# Patient Record
Sex: Female | Born: 1935 | Race: White | Hispanic: No | Smoking: Never smoker
Health system: Southern US, Community
[De-identification: ages and names within clinical notes are randomized; demographics above are authoritative.]

## PROBLEM LIST (undated history)

## (undated) ENCOUNTER — Emergency Department (HOSPITAL_COMMUNITY): Admission: EM | Payer: Medicare Other | Source: Home / Self Care

## (undated) DIAGNOSIS — F32A Depression, unspecified: Secondary | ICD-10-CM

## (undated) DIAGNOSIS — E785 Hyperlipidemia, unspecified: Secondary | ICD-10-CM

## (undated) DIAGNOSIS — F329 Major depressive disorder, single episode, unspecified: Secondary | ICD-10-CM

## (undated) DIAGNOSIS — H3530 Unspecified macular degeneration: Secondary | ICD-10-CM

## (undated) DIAGNOSIS — G5710 Meralgia paresthetica, unspecified lower limb: Secondary | ICD-10-CM

## (undated) DIAGNOSIS — R059 Cough, unspecified: Secondary | ICD-10-CM

## (undated) DIAGNOSIS — R251 Tremor, unspecified: Secondary | ICD-10-CM

## (undated) DIAGNOSIS — M25561 Pain in right knee: Secondary | ICD-10-CM

## (undated) DIAGNOSIS — R05 Cough: Secondary | ICD-10-CM

## (undated) HISTORY — DX: Cough: R05

## (undated) HISTORY — DX: Major depressive disorder, single episode, unspecified: F32.9

## (undated) HISTORY — DX: Cough, unspecified: R05.9

## (undated) HISTORY — DX: Depression, unspecified: F32.A

## (undated) HISTORY — DX: Tremor, unspecified: R25.1

## (undated) HISTORY — DX: Hyperlipidemia, unspecified: E78.5

## (undated) HISTORY — PX: APPENDECTOMY: SHX54

## (undated) HISTORY — DX: Pain in right knee: M25.561

## (undated) HISTORY — DX: Meralgia paresthetica, unspecified lower limb: G57.10

## (undated) HISTORY — DX: Unspecified macular degeneration: H35.30

## (undated) HISTORY — PX: TONSILLECTOMY AND ADENOIDECTOMY: SHX28

---

## 1998-07-14 ENCOUNTER — Other Ambulatory Visit: Admission: RE | Admit: 1998-07-14 | Discharge: 1998-07-14 | Payer: Self-pay | Admitting: Internal Medicine

## 1999-05-06 ENCOUNTER — Emergency Department (HOSPITAL_COMMUNITY): Admission: EM | Admit: 1999-05-06 | Discharge: 1999-05-06 | Payer: Self-pay | Admitting: Emergency Medicine

## 1999-05-06 ENCOUNTER — Encounter: Payer: Self-pay | Admitting: Emergency Medicine

## 1999-09-14 ENCOUNTER — Other Ambulatory Visit: Admission: RE | Admit: 1999-09-14 | Discharge: 1999-09-14 | Payer: Self-pay | Admitting: Internal Medicine

## 1999-10-05 ENCOUNTER — Encounter: Payer: Self-pay | Admitting: Internal Medicine

## 1999-10-05 ENCOUNTER — Encounter: Admission: RE | Admit: 1999-10-05 | Discharge: 1999-10-05 | Payer: Self-pay | Admitting: Internal Medicine

## 2000-09-17 ENCOUNTER — Other Ambulatory Visit: Admission: RE | Admit: 2000-09-17 | Discharge: 2000-09-17 | Payer: Self-pay | Admitting: Internal Medicine

## 2000-12-04 ENCOUNTER — Encounter: Payer: Self-pay | Admitting: Internal Medicine

## 2000-12-04 ENCOUNTER — Encounter: Admission: RE | Admit: 2000-12-04 | Discharge: 2000-12-04 | Payer: Self-pay | Admitting: Internal Medicine

## 2001-09-18 ENCOUNTER — Other Ambulatory Visit: Admission: RE | Admit: 2001-09-18 | Discharge: 2001-09-18 | Payer: Self-pay | Admitting: Internal Medicine

## 2001-12-16 ENCOUNTER — Encounter: Admission: RE | Admit: 2001-12-16 | Discharge: 2001-12-16 | Payer: Self-pay | Admitting: Internal Medicine

## 2001-12-16 ENCOUNTER — Encounter: Payer: Self-pay | Admitting: Internal Medicine

## 2002-09-21 ENCOUNTER — Other Ambulatory Visit: Admission: RE | Admit: 2002-09-21 | Discharge: 2002-09-21 | Payer: Self-pay | Admitting: Internal Medicine

## 2002-12-22 ENCOUNTER — Encounter: Payer: Self-pay | Admitting: Internal Medicine

## 2002-12-22 ENCOUNTER — Encounter: Admission: RE | Admit: 2002-12-22 | Discharge: 2002-12-22 | Payer: Self-pay | Admitting: Internal Medicine

## 2003-09-15 ENCOUNTER — Ambulatory Visit (HOSPITAL_COMMUNITY): Admission: RE | Admit: 2003-09-15 | Discharge: 2003-09-15 | Payer: Self-pay | Admitting: *Deleted

## 2003-09-15 ENCOUNTER — Encounter (INDEPENDENT_AMBULATORY_CARE_PROVIDER_SITE_OTHER): Payer: Self-pay | Admitting: Specialist

## 2003-09-27 ENCOUNTER — Other Ambulatory Visit: Admission: RE | Admit: 2003-09-27 | Discharge: 2003-09-27 | Payer: Self-pay | Admitting: Internal Medicine

## 2003-12-28 ENCOUNTER — Encounter: Admission: RE | Admit: 2003-12-28 | Discharge: 2003-12-28 | Payer: Self-pay | Admitting: Internal Medicine

## 2004-02-10 ENCOUNTER — Ambulatory Visit (HOSPITAL_COMMUNITY): Admission: RE | Admit: 2004-02-10 | Discharge: 2004-02-10 | Payer: Self-pay | Admitting: Orthopedic Surgery

## 2004-03-13 ENCOUNTER — Ambulatory Visit (HOSPITAL_BASED_OUTPATIENT_CLINIC_OR_DEPARTMENT_OTHER): Admission: RE | Admit: 2004-03-13 | Discharge: 2004-03-13 | Payer: Self-pay | Admitting: Orthopedic Surgery

## 2004-05-08 ENCOUNTER — Ambulatory Visit (HOSPITAL_COMMUNITY): Admission: RE | Admit: 2004-05-08 | Discharge: 2004-05-08 | Payer: Self-pay | Admitting: Orthopedic Surgery

## 2004-10-20 ENCOUNTER — Other Ambulatory Visit: Admission: RE | Admit: 2004-10-20 | Discharge: 2004-10-20 | Payer: Self-pay | Admitting: Internal Medicine

## 2005-01-18 ENCOUNTER — Encounter: Admission: RE | Admit: 2005-01-18 | Discharge: 2005-01-18 | Payer: Self-pay | Admitting: Internal Medicine

## 2006-03-12 ENCOUNTER — Encounter: Admission: RE | Admit: 2006-03-12 | Discharge: 2006-03-12 | Payer: Self-pay | Admitting: Internal Medicine

## 2006-07-02 HISTORY — PX: LUNG BIOPSY: SHX232

## 2007-03-14 ENCOUNTER — Encounter: Admission: RE | Admit: 2007-03-14 | Discharge: 2007-03-14 | Payer: Self-pay | Admitting: Internal Medicine

## 2007-12-25 ENCOUNTER — Other Ambulatory Visit: Admission: RE | Admit: 2007-12-25 | Discharge: 2007-12-25 | Payer: Self-pay | Admitting: Internal Medicine

## 2007-12-26 ENCOUNTER — Encounter: Admission: RE | Admit: 2007-12-26 | Discharge: 2007-12-26 | Payer: Self-pay | Admitting: Internal Medicine

## 2008-01-06 ENCOUNTER — Ambulatory Visit (HOSPITAL_COMMUNITY): Admission: RE | Admit: 2008-01-06 | Discharge: 2008-01-06 | Payer: Self-pay | Admitting: Internal Medicine

## 2008-01-08 ENCOUNTER — Ambulatory Visit (HOSPITAL_COMMUNITY): Admission: RE | Admit: 2008-01-08 | Discharge: 2008-01-08 | Payer: Self-pay | Admitting: Internal Medicine

## 2008-03-25 ENCOUNTER — Encounter: Admission: RE | Admit: 2008-03-25 | Discharge: 2008-03-25 | Payer: Self-pay | Admitting: Internal Medicine

## 2008-07-09 ENCOUNTER — Emergency Department (HOSPITAL_COMMUNITY): Admission: EM | Admit: 2008-07-09 | Discharge: 2008-07-09 | Payer: Self-pay | Admitting: Emergency Medicine

## 2008-07-12 ENCOUNTER — Encounter: Admission: RE | Admit: 2008-07-12 | Discharge: 2008-07-12 | Payer: Self-pay | Admitting: Internal Medicine

## 2008-12-08 ENCOUNTER — Encounter: Admission: RE | Admit: 2008-12-08 | Discharge: 2008-12-08 | Payer: Self-pay | Admitting: Internal Medicine

## 2009-03-29 ENCOUNTER — Encounter: Admission: RE | Admit: 2009-03-29 | Discharge: 2009-03-29 | Payer: Self-pay | Admitting: Internal Medicine

## 2009-05-18 ENCOUNTER — Encounter: Admission: RE | Admit: 2009-05-18 | Discharge: 2009-05-18 | Payer: Self-pay | Admitting: Internal Medicine

## 2009-06-07 ENCOUNTER — Emergency Department (HOSPITAL_COMMUNITY): Admission: EM | Admit: 2009-06-07 | Discharge: 2009-06-07 | Payer: Self-pay | Admitting: Emergency Medicine

## 2010-06-18 IMAGING — CT CT HEAD W/O CM
1 of 2 series · 15 of 30 positions shown, 19 images · non-contrast
Comparison: No comparison head or facial CT.  Comparison sinus CT
[DATE].

CT HEAD

CLINICAL DATA: Fall.  Abrasions.  Headache.

CT HEAD WITHOUT CONTRAST
CT MAXILLOFACIAL WITHOUT CONTRAST
TECHNIQUE: Multidetector CT imaging of the head and maxillofacial
structures were performed using the standard protocol without
intravenous contrast. Multiplanar CT image reconstructions of the
maxillofacial structures were also generated.

[Series 3: orbit 2.0 h32s · axial · 0.29mm/px · z∈[-261,-117]mm · 15 of 80 slices shown, 19 images]
[im 4/80  brain]
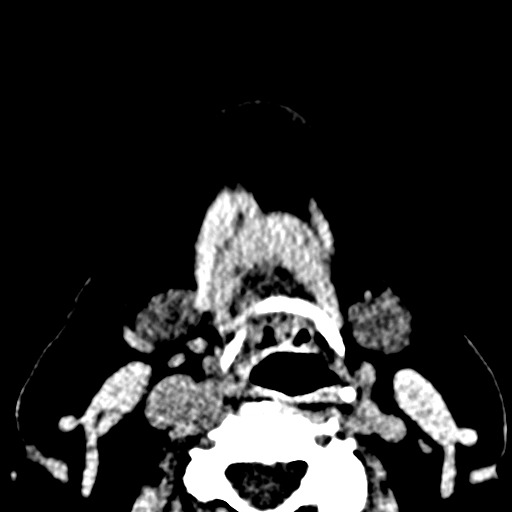
[im 4/80  bone]
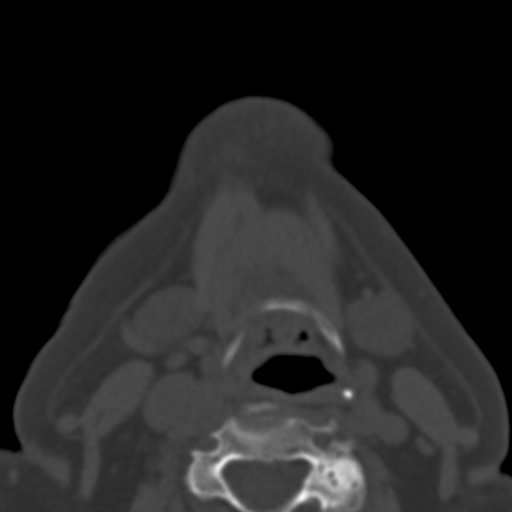
[im 8/80  brain]
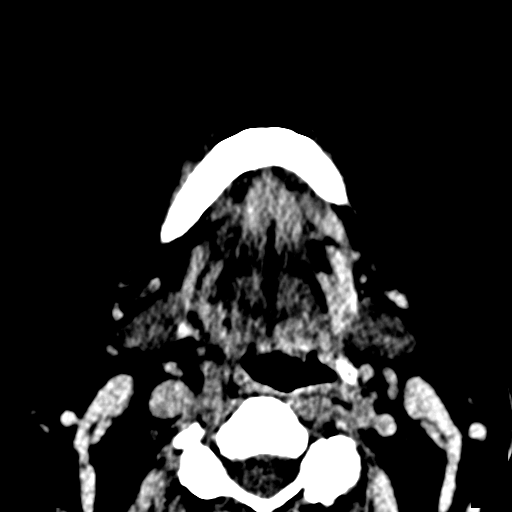
[im 16/80  brain]
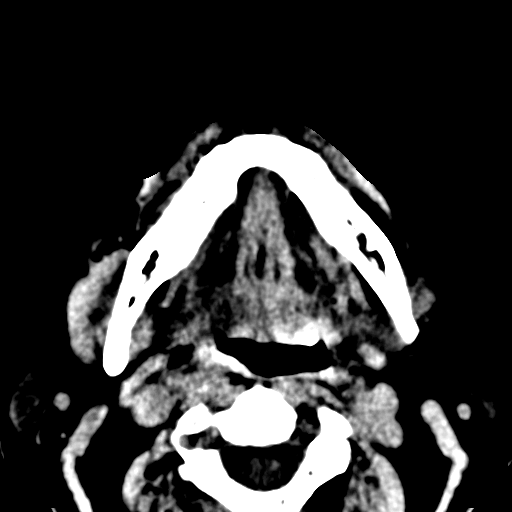
[im 20/80  brain]
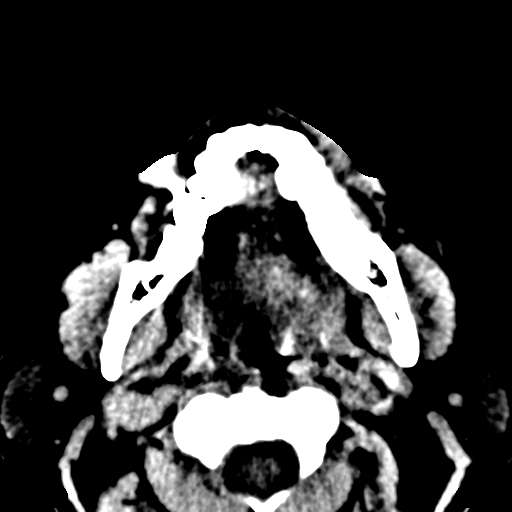
[im 24/80  brain]
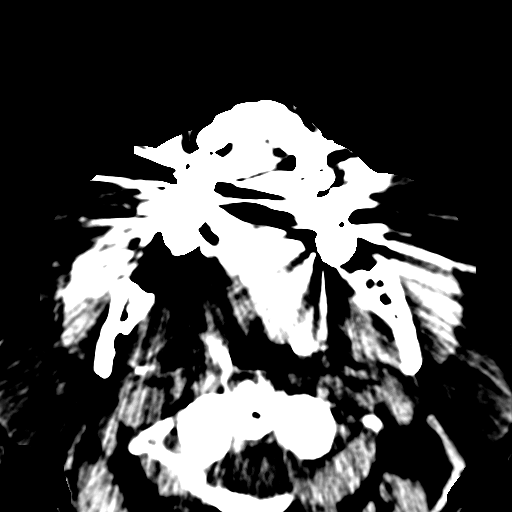
[im 24/80  bone]
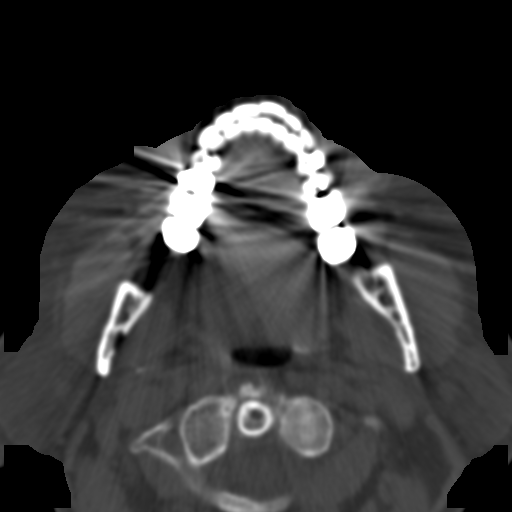
[im 28/80  brain]
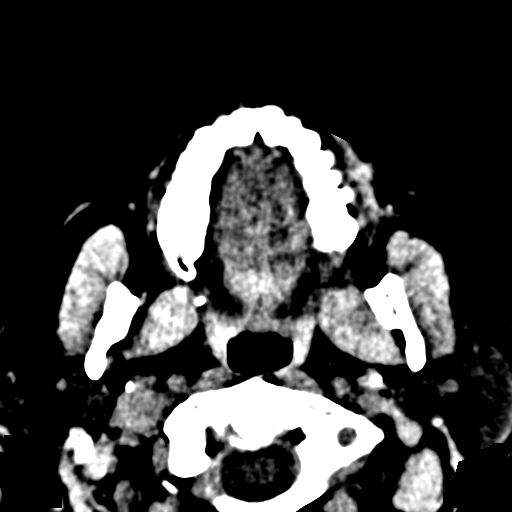
[im 36/80  brain]
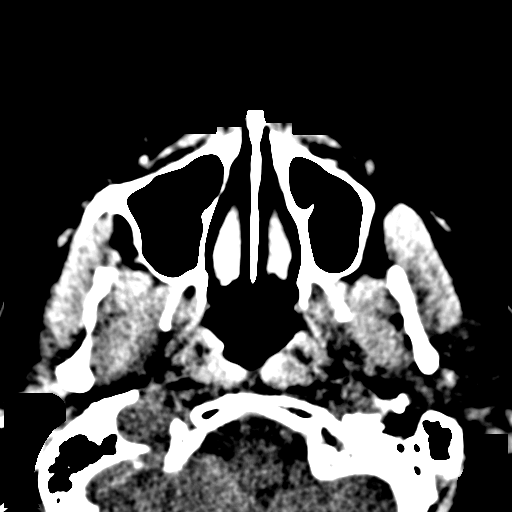
[im 40/80  brain]
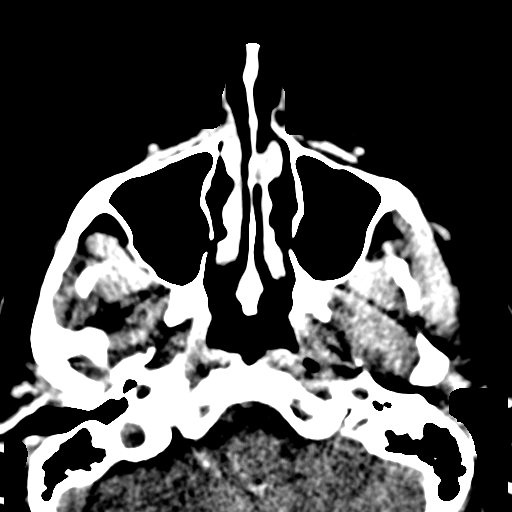
[im 44/80  brain]
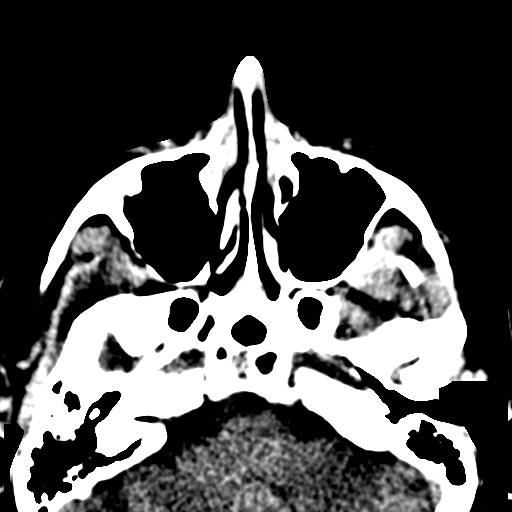
[im 44/80  bone]
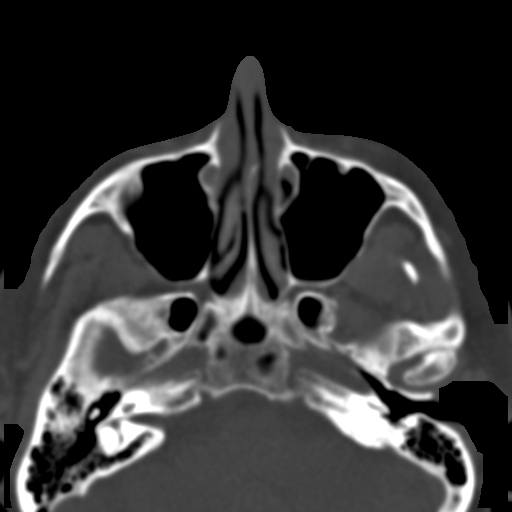
[im 52/80  brain]
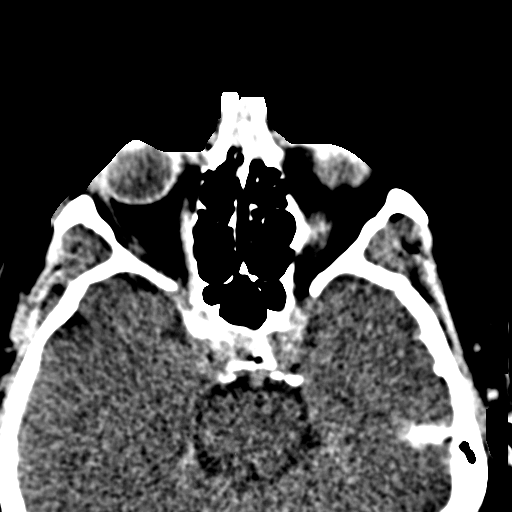
[im 56/80  brain]
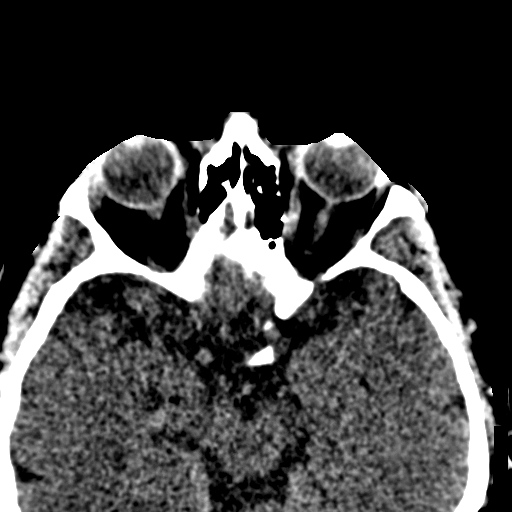
[im 60/80  brain]
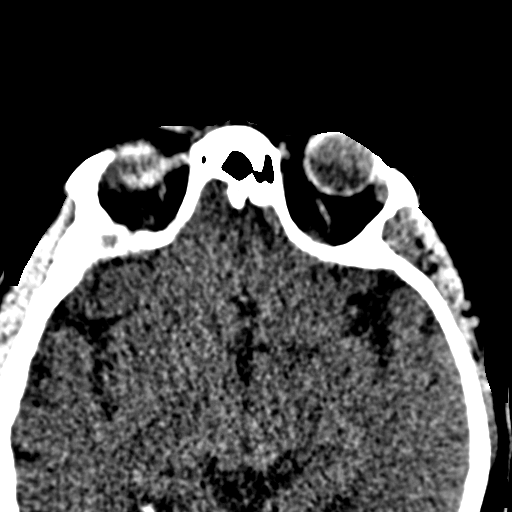
[im 64/80  brain]
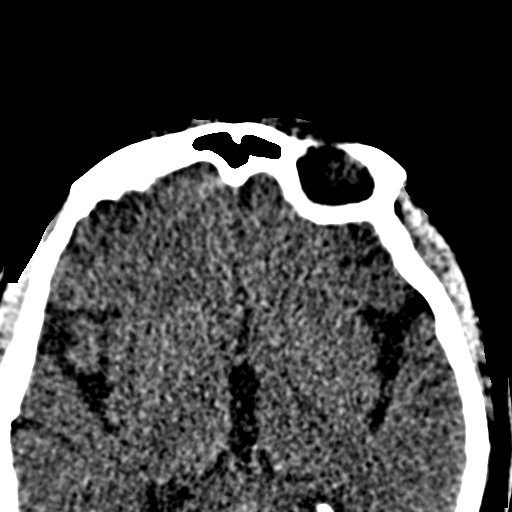
[im 64/80  bone]
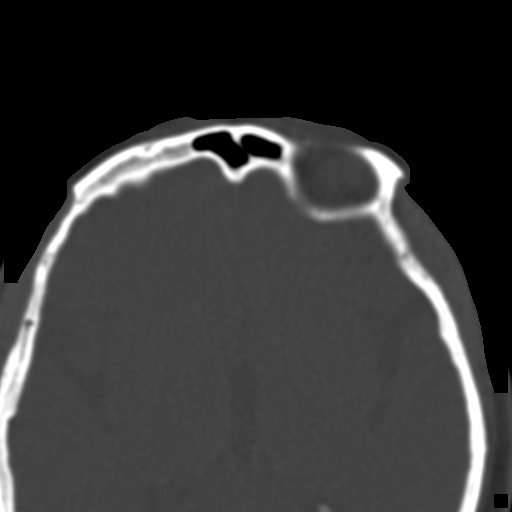
[im 72/80  brain]
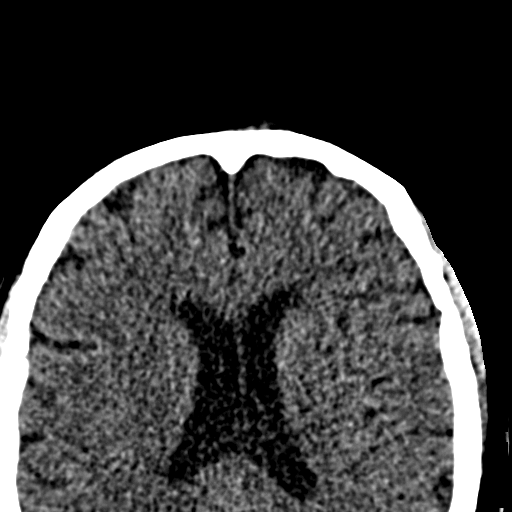
[im 76/80  brain]
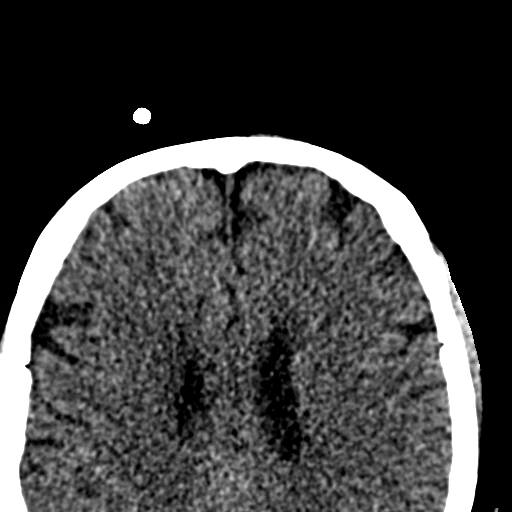

[15 of 30 positions shown; findings below may reference images not displayed]

FINDINGS: Nasal bone fracture.  Please see below.  No skull
fracture otherwise detected.  No intracranial hemorrhage.  No CT
evidence of large acute infarct.  Small acute infarct cannot be
excluded by CT. No intracranial mass detected on this unenhanced
exam.
IMPRESSION: Nasal bone fracture.

No intracranial hemorrhage.

CT MAXILLOFACIAL
FINDINGS: Bilateral nasal bone fracture with mild displacement.
No other fracture identified.  Small amount of fluid within the
left maxillary sinus.
IMPRESSION: Bilateral nasal bone fracture.

## 2010-06-18 IMAGING — CR DG HAND COMPLETE 3+V*L*
3 series · 3 of 3 positions shown · non-contrast
Comparison: None

CLINICAL DATA: Pain

LEFT HAND - COMPLETE 3+ VIEW

[view not recorded (1 of 3)]
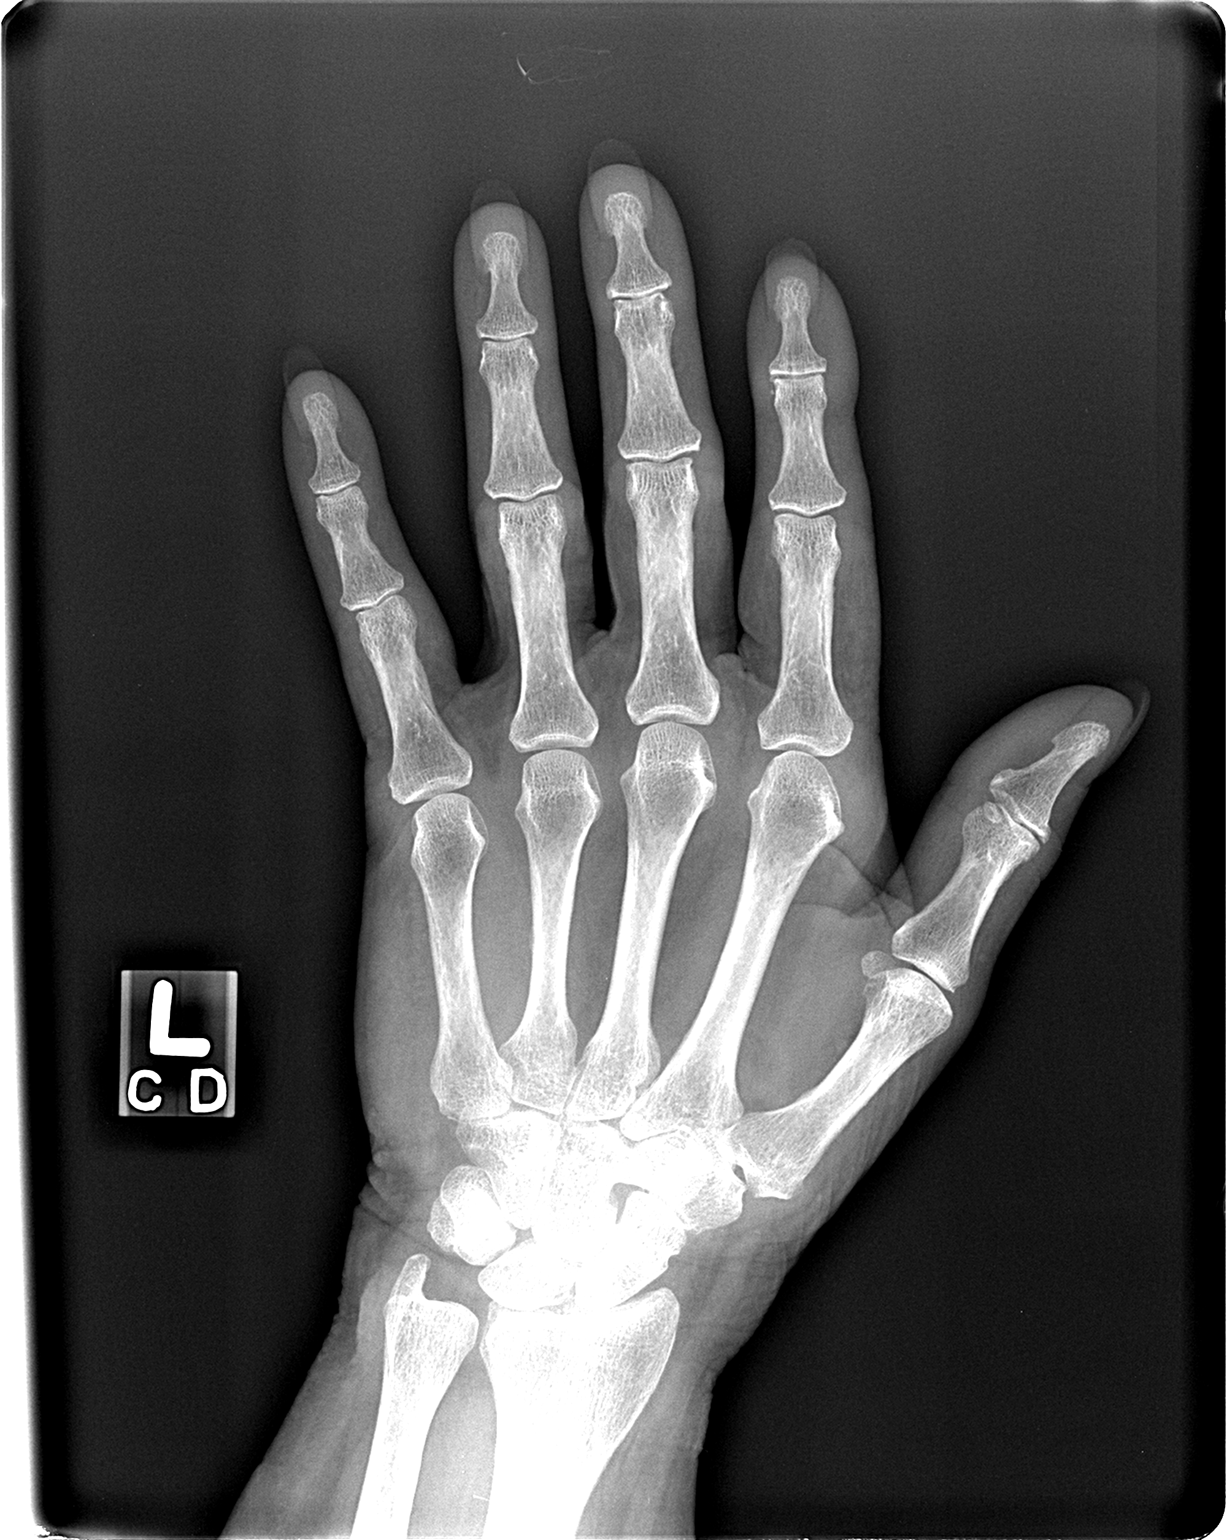

[view not recorded (2 of 3)]
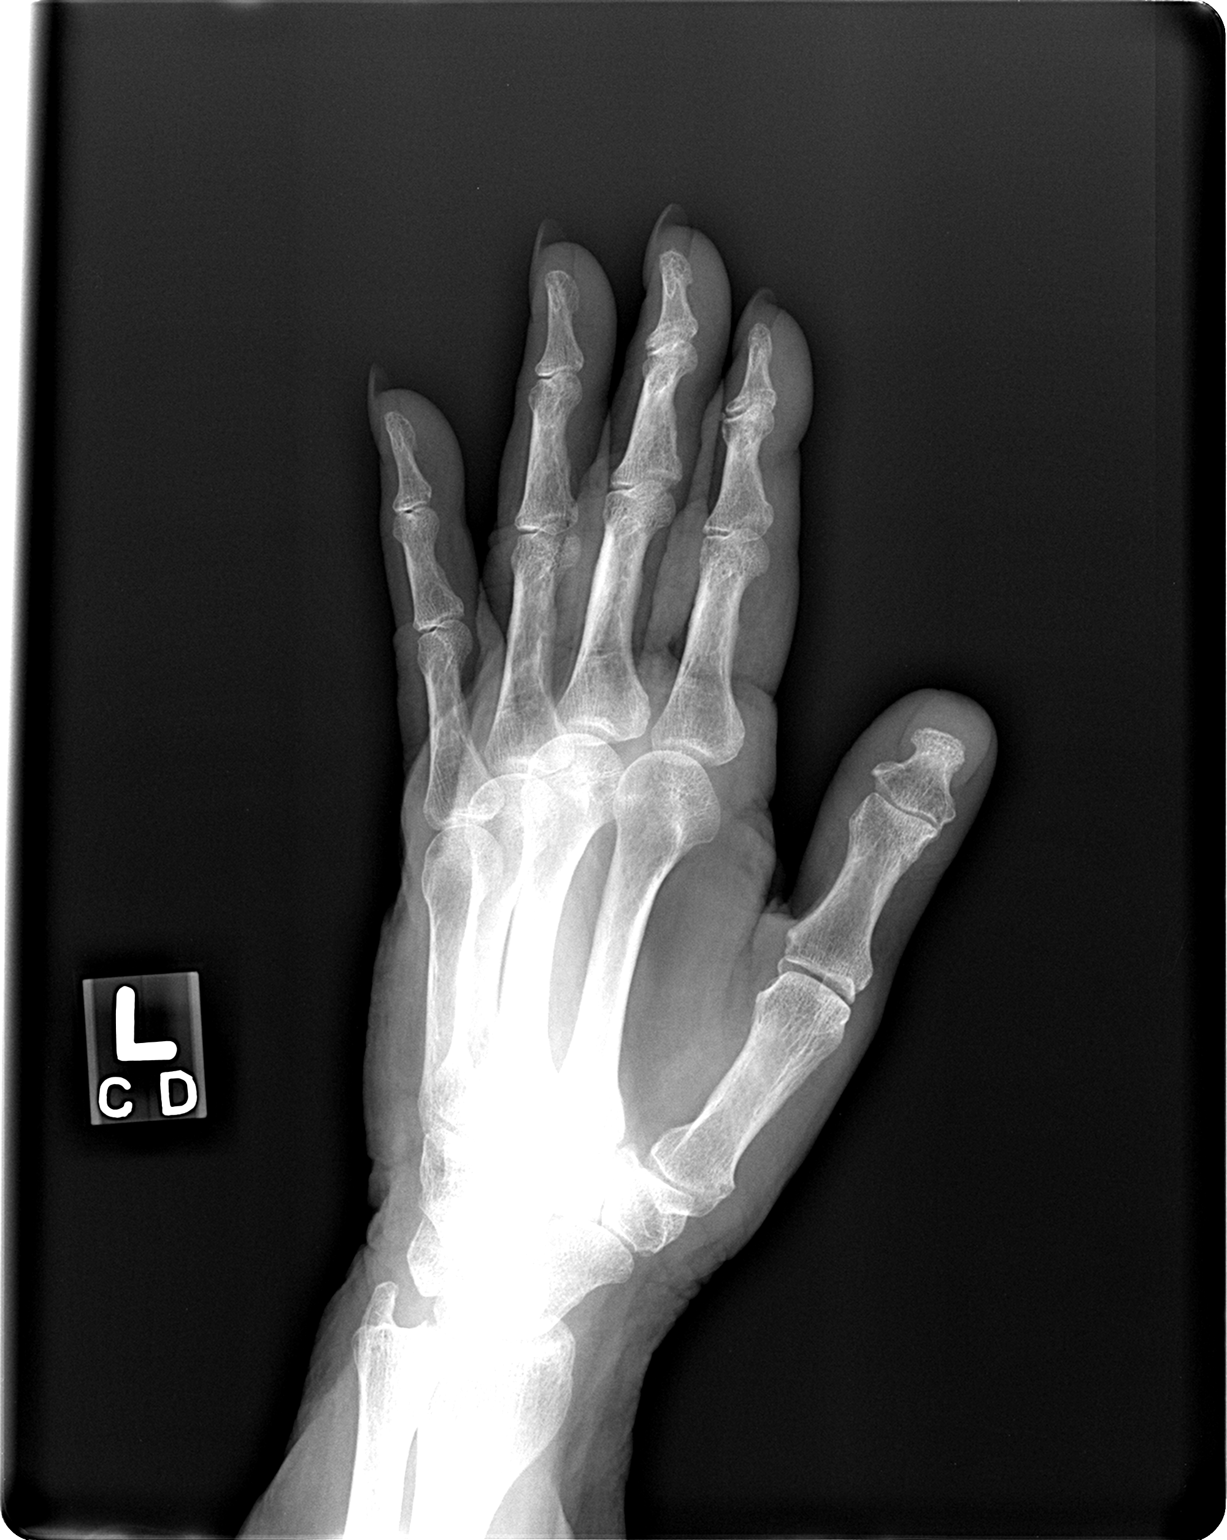

[view not recorded (3 of 3)]
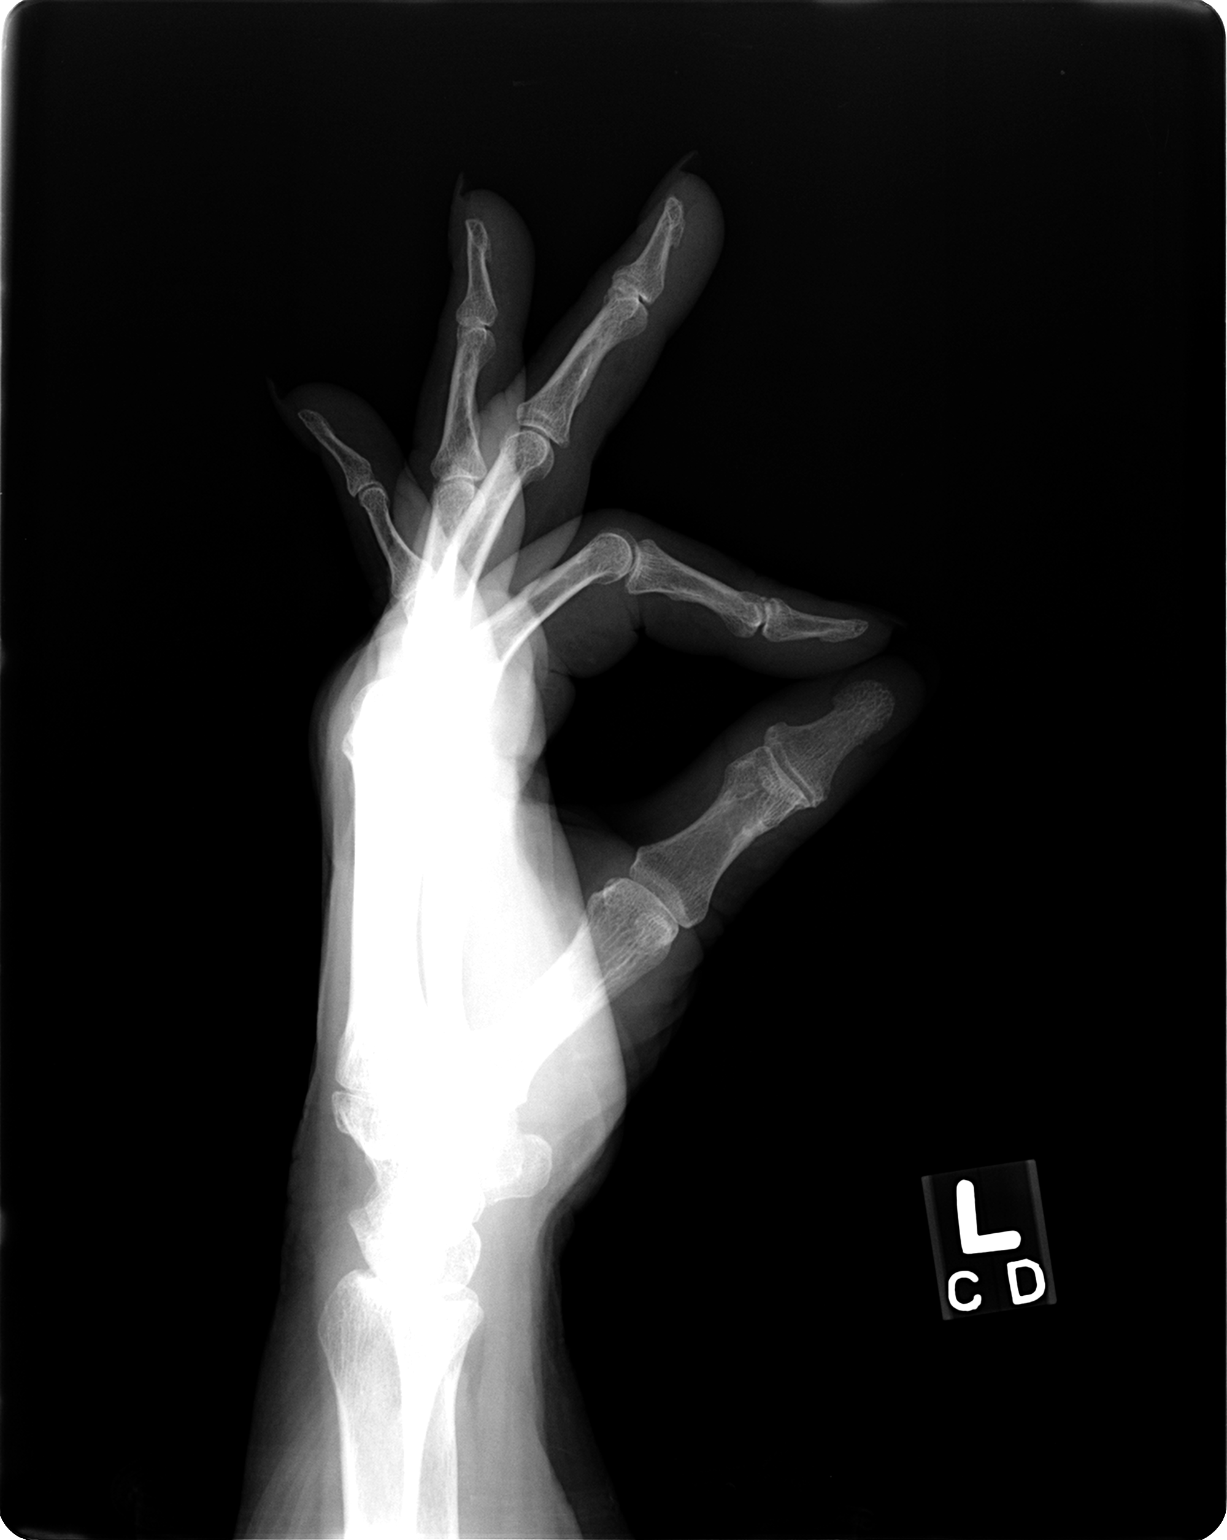

[3 of 3 positions shown; findings below may reference images not displayed]

FINDINGS: Three views of the left hand submitted.  No acute fracture or
subluxation.  No radiopaque foreign body.
IMPRESSION: No acute fracture or subluxation.

## 2010-07-18 ENCOUNTER — Encounter
Admission: RE | Admit: 2010-07-18 | Discharge: 2010-07-18 | Payer: Self-pay | Source: Home / Self Care | Attending: Internal Medicine | Admitting: Internal Medicine

## 2010-07-23 ENCOUNTER — Encounter: Payer: Self-pay | Admitting: Internal Medicine

## 2010-10-16 LAB — CK TOTAL AND CKMB (NOT AT ARMC)
CK, MB: 1.8 ng/mL (ref 0.3–4.0)
Relative Index: INVALID (ref 0.0–2.5)
Total CK: 78 U/L (ref 7–177)

## 2010-10-16 LAB — DIFFERENTIAL
Basophils Relative: 1 % (ref 0–1)
Eosinophils Absolute: 0 10*3/uL (ref 0.0–0.7)
Eosinophils Relative: 0 % (ref 0–5)
Lymphocytes Relative: 16 % (ref 12–46)
Lymphs Abs: 1.7 10*3/uL (ref 0.7–4.0)
Neutro Abs: 8.1 10*3/uL — ABNORMAL HIGH (ref 1.7–7.7)
Neutrophils Relative %: 79 % — ABNORMAL HIGH (ref 43–77)

## 2010-10-16 LAB — COMPREHENSIVE METABOLIC PANEL
ALT: 19 U/L (ref 0–35)
BUN: 8 mg/dL (ref 6–23)
CO2: 22 mEq/L (ref 19–32)
Chloride: 108 mEq/L (ref 96–112)
Total Bilirubin: 1.1 mg/dL (ref 0.3–1.2)
Total Protein: 6.1 g/dL (ref 6.0–8.3)

## 2010-10-16 LAB — CBC
MCHC: 33.9 g/dL (ref 30.0–36.0)
MCV: 90.3 fL (ref 78.0–100.0)
RBC: 4.69 MIL/uL (ref 3.87–5.11)

## 2010-11-17 NOTE — Op Note (Signed)
NAMEKORALINE, PHILLIPSON                         ACCOUNT NO.:  [id number]   MEDICAL RECORD NO.:  [id number]                   PATIENT TYPE:  AMB   LOCATION:  ENDO                                 FACILITY:  [facility]   PHYSICIAN:  Georgiana Spinner, M.D.                 DATE OF BIRTH:  01-May-1932   DATE OF PROCEDURE:  09/15/2003  DATE OF DISCHARGE:                                 OPERATIVE REPORT   PROCEDURE:  Upper endoscopy.   INDICATIONS:  Gastroesophageal reflux disease.   ANESTHESIA:  1. Demerol 60 mg.  2. Versed 6 mg.   DESCRIPTION OF PROCEDURE:  With patient mildly sedated in the left lateral  decubitus position, the Olympus videoscopic endoscope was inserted in the  mouth, passed under direct vision through the esophagus, which showed some  irregularity of the Z-line which was photographed.  But first, we entered  into the stomach.  Fundus, body, antrum, duodenal bulb, second portion of  duodenum were visualized.  From this point, the endoscope was slowly  withdrawn, taking circumferential views of the duodenal mucosa until the  endoscope then pulled back into the stomach, placed in retroflexion to view  the stomach from below.  The endoscope was then straightened, withdrawn,  taking circumferential views of the remaining gastric and esophageal mucosa,  stopping in the fundus of the stomach where there were changes in the  stomach of geophagism and I photographed and biopsied to rule out gastritis.  The endoscope was then pulled back into the distal esophagus where the area  of the distal esophagus was biopsied just one time, and I decided not to  biopsy further.  The endoscope was withdrawn.  The patient's vital signs and  pulse oximeter remained stable.  The patient tolerated the procedure well  without apparent complications.   FINDINGS:  1. Changes as described above.  Geophagism of the stomach and changes of     irregularity of the distal esophagus, both biopsied.  2. Await  biopsy report.  3. The patient will call me for results and follow up with me as an     outpatient.  4. Proceed to colonoscopy as planned.                                               Georgiana Spinner, M.D.    GMO/MEDQ  D:  09/15/2003  T:  09/15/2003  Job:  [id number]

## 2010-11-17 NOTE — Op Note (Signed)
NAMETIMMIA, COGBURN                         ACCOUNT NO.:  [id number]   MEDICAL RECORD NO.:  [id number]                   PATIENT TYPE:  AMB   LOCATION:  ENDO                                 FACILITY:  [facility]   PHYSICIAN:  Georgiana Spinner, M.D.                 DATE OF BIRTH:  01/25/1932   DATE OF PROCEDURE:  09/15/2003  DATE OF DISCHARGE:                                 OPERATIVE REPORT   PROCEDURE:  Colonoscopy.   INDICATIONS:  Colon polyps and rectal bleeding.   ANESTHESIA:  1. Demerol 10 mg.  2. Versed 1 mg.   DESCRIPTION OF PROCEDURE:  With the patient mildly sedated in the left  lateral decubitus position, the Olympus videoscopic colonoscope was inserted  in the rectum and passed under direct vision to the cecum, identified by the  ileocecal valve and appendiceal orifice, both of which were photographed.  From this point, the colonoscope was slowly withdrawn, taking  circumferential views of the colonic mucosa, stopping only in the rectum  which appeared normal on direct, showed hemorrhoids on retroflexed view.  The endoscope was straightened and withdrawn.  The patient's vital signs and  pulse oximeter remained stable.  The patient tolerated the procedure well  without apparent complications.   FINDINGS:  Internal hemorrhoids, otherwise unremarkable exam.   PLAN:  See endoscopy note for further details.                                               Georgiana Spinner, M.D.    GMO/MEDQ  D:  09/15/2003  T:  09/15/2003  Job:  [id number]

## 2010-11-17 NOTE — Op Note (Signed)
NAMELILLAN, MCCREADIE                         ACCOUNT NO.:  [id number]   MEDICAL RECORD NO.:  [id number]                   PATIENT TYPE:  AMB   LOCATION:  DSC                                  FACILITY:  [facility]   PHYSICIAN:  Robert A. Thurston Hole, M.D.              DATE OF BIRTH:  May 19, 1932   DATE OF PROCEDURE:  03/13/2004  DATE OF DISCHARGE:                                 OPERATIVE REPORT   PREOPERATIVE DIAGNOSIS:  Left knee chondromalacia and synovitis.   POSTOPERATIVE DIAGNOSIS:  1.  Left knee medial and lateral meniscal tears.  2.  Left knee chondromalacia.  3.  Left knee synovitis.   PROCEDURE:  1.  Left knee examination under anesthesia for an arthroscopic partial      medial and lateral meniscectomies.  2.  Left knee chondroplasty with partial synovectomy.   SURGEON:  Elana Alm. Thurston Hole, M.D.   ASSISTANT:  Julien Girt, P.A.   ANESTHESIA:  General.   OPERATIVE TIME:  40 minutes   COMPLICATIONS:  None.   INDICATIONS FOR PROCEDURE:  Ms. Sochacki is a 77 year old woman who has had  three to four months of increasing left knee pain with exam and MRI  documenting chondromalacia and synovitis with possible meniscal tearing, who  has failed conservative care and is now to undergo arthroscopy.   DESCRIPTION OF PROCEDURE:  Ms. Ebbert is brought to the operating room on  March 13, 2004 and placed on the operative table in the supine position.  After an adequate level of general anesthesia was attained, her left knee  was examined. Range of motion is 0 to 125 degrees, 1 to 2+ crepitation. Knee  stable to ligamentous examination and normal patellar tracking.  The knee  was injected with 0.25% Marcaine with epinephrine in the medial and lateral  portal sites under sterile conditions.  The left leg was then prepped using  sterile Duraprep and draped using sterile technique.  Originally through the  anterior and lateral portal, the arthroscope with the pump attached was  placed  into the anteromedial portal and an arthroscopic probe was placed.  On initial inspection of the medial compartment, she was found to have 75%  grade 3 chondromalacia, which was debrided.  The medial meniscus tearing of  posterior medial horn, of which 50% was resected back to a stable rim.  The  intercondylar notch was inspected and the anterior and posterior cruciate  ligaments were normal.  The lateral compartment was inspected and 25% grade  3 chondromalacia was debrided.  The lateral meniscus tear, 25% to 30%,  posterior and lateral corner was resected back to a stable rim.  The  patellofemoral joint showed 50% grade 3 chondromalacia on the patella and  femoral groove and this was debrided.  The patella tracked normally.  Moderate synovitis of the medial and lateral gutters were debrided,  otherwise they were free of pathology.  After this was done,  it was felt  that all pathology had been satisfactorily addressed.  The instruments were  removed.  The portals were closed with 3-0 nylon suture and injected with  0.25% Marcaine with epinephrine and 4 mg of morphine.  Sterile dressings  were applied and the patient awakened and taken to the recovery room in  stable condition.   FOLLOW-UP CARE:  Ms. Pinkus will be followed as an outpatient on Vicodin and  Voltaren. She will be back in my office in a week for sutures out and  followup.                                               Robert A. Thurston Hole, M.D.    RAW/MEDQ  D:  03/13/2004  T:  03/13/2004  Job:  [id number]

## 2011-08-07 ENCOUNTER — Other Ambulatory Visit: Payer: Self-pay | Admitting: Internal Medicine

## 2011-08-07 DIAGNOSIS — Z1231 Encounter for screening mammogram for malignant neoplasm of breast: Secondary | ICD-10-CM

## 2011-08-16 DIAGNOSIS — N390 Urinary tract infection, site not specified: Secondary | ICD-10-CM | POA: Diagnosis not present

## 2011-08-16 DIAGNOSIS — J069 Acute upper respiratory infection, unspecified: Secondary | ICD-10-CM | POA: Diagnosis not present

## 2011-08-16 DIAGNOSIS — R109 Unspecified abdominal pain: Secondary | ICD-10-CM | POA: Diagnosis not present

## 2011-08-16 DIAGNOSIS — R1083 Colic: Secondary | ICD-10-CM | POA: Diagnosis not present

## 2011-08-24 DIAGNOSIS — J209 Acute bronchitis, unspecified: Secondary | ICD-10-CM | POA: Diagnosis not present

## 2011-09-05 ENCOUNTER — Ambulatory Visit: Payer: Self-pay

## 2011-09-06 ENCOUNTER — Ambulatory Visit
Admission: RE | Admit: 2011-09-06 | Discharge: 2011-09-06 | Disposition: A | Discharge status: Home / Self Care | Payer: Medicare Other | Source: Ambulatory Visit | Attending: Internal Medicine | Admitting: Internal Medicine

## 2011-09-06 DIAGNOSIS — Z1231 Encounter for screening mammogram for malignant neoplasm of breast: Secondary | ICD-10-CM

## 2011-09-10 DIAGNOSIS — R05 Cough: Secondary | ICD-10-CM | POA: Diagnosis not present

## 2011-09-14 DIAGNOSIS — H3530 Unspecified macular degeneration: Secondary | ICD-10-CM | POA: Diagnosis not present

## 2011-09-14 DIAGNOSIS — H35369 Drusen (degenerative) of macula, unspecified eye: Secondary | ICD-10-CM | POA: Diagnosis not present

## 2011-09-14 DIAGNOSIS — H0230 Blepharochalasis unspecified eye, unspecified eyelid: Secondary | ICD-10-CM | POA: Diagnosis not present

## 2011-09-14 DIAGNOSIS — H2510 Age-related nuclear cataract, unspecified eye: Secondary | ICD-10-CM | POA: Diagnosis not present

## 2011-11-08 DIAGNOSIS — R05 Cough: Secondary | ICD-10-CM | POA: Diagnosis not present

## 2011-12-01 ENCOUNTER — Ambulatory Visit (INDEPENDENT_AMBULATORY_CARE_PROVIDER_SITE_OTHER): Payer: Medicare Other | Admitting: Family Medicine

## 2011-12-01 VITALS — BP 152/90 | HR 84 | Temp 98.2°F | Resp 16 | Ht 63.0 in | Wt 210.0 lb

## 2011-12-01 DIAGNOSIS — Z79899 Other long term (current) drug therapy: Secondary | ICD-10-CM

## 2011-12-01 DIAGNOSIS — L259 Unspecified contact dermatitis, unspecified cause: Secondary | ICD-10-CM

## 2011-12-01 DIAGNOSIS — T7840XA Allergy, unspecified, initial encounter: Secondary | ICD-10-CM

## 2011-12-01 LAB — GLUCOSE, POCT (MANUAL RESULT ENTRY): POC Glucose: 100 mg/dl — AB (ref 70–99)

## 2011-12-01 MED ORDER — PREDNISONE 20 MG PO TABS: ORAL_TABLET | ORAL | Status: DC

## 2011-12-01 MED ORDER — METHYLPREDNISOLONE ACETATE 80 MG/ML IJ SUSP
80.0000 mg | Freq: Once | INTRAMUSCULAR | Status: AC
  Administered 2011-12-01: 80 mg via INTRAMUSCULAR

## 2011-12-01 NOTE — Progress Notes (Signed)
  Subjective:    Patient ID: Erica [name], female    DOB: May 26, 1932, 78 y.o.   MRN: [id number]  HPI Erica [name] is a 78 y.o. female  Rash around eyes - started 3 days ago - swelling around eyes.  No new dermatologic products - uses Marjorie Smolder.  Itches, no pain.  No fever.    Was at beach house night before - different bedding - down comforter and pillows.    Shellfish allergy, but no known exposure.    Now rash on back of hairline, back of neck/hairline and down L arm and chin.    No shortness of breath, eating,  Drinking, or trouble swallowing.  Has had a chronic cough. Dry cough - followed by allergist in [facility]. No recent changes.  Tx: benadryl - 1 each day.   Has taken  No history of diabetes.    Review of Systems  HENT: Negative for trouble swallowing.   Respiratory: Positive for cough. Negative for shortness of breath, wheezing and stridor.   Skin: Positive for rash.       Objective:   Physical Exam  Constitutional: She is oriented to person, place, and time. She appears well-developed and well-nourished.  HENT:  Head: Normocephalic and atraumatic.  Eyes: Conjunctivae and EOM are normal. Pupils are equal, round, and reactive to light.       Periorbital R >L erythema, slight lid edema.  Erythematous patches with excoriation in few areas of face, L chest wall and arm, and posterior neck.  Pulmonary/Chest: Effort normal and breath sounds normal. No respiratory distress. She has no wheezes.  Neurological: She is alert and oriented to person, place, and time.  Skin: Rash noted.  Psychiatric: She has a normal mood and affect. Her behavior is normal.   Results for orders placed in visit on 12/01/11  GLUCOSE, POCT (MANUAL RESULT ENTRY)      Component Value Range   POC Glucose 100 (*) 70 - 99 (mg/dl)       Assessment & Plan:   Erica [name] is a 78 y.o. female 1. Allergic reaction  POCT glucose (manual entry)  2. Contact dermatitis  POCT glucose (manual  entry)  3. High risk medication use  POCT glucose (manual entry)  Contact derm/allergic reaction - face - eyes, neck arm.  Possibly due to detergent, fabric softener, or pillow contents when at beach, as sx's started next am.    Benadryl otc - Q 4-6 h today, then can switch to Zyrtec tomorrow if improving.   No makeup to involved areas until fully cleared.   Depomedrol 80mg  IM, then tomorrow start prednisone 20mg  - 2 qd x 5 days. SED, RTC precautions.  REcheck in 48-72 hours if not improving - sooner if worse.

## 2011-12-01 NOTE — Patient Instructions (Addendum)
Benadryl over the counter today - up to every 4 to 6 hours as needed.  You can switch to Zyrtec tomorrow if improving.   No makeup, creams or lotions to involved areas until fully cleared.   Cetaphil Liquid Cleanser is ok to use.   Depomedrol 80mg  IM given today,  then tomorrow can start prednisone 20mg  - 2 each day for 5 days.  Recheck in 48-72 hours if not improving - sooner here or to emergency room if worse.

## 2011-12-06 ENCOUNTER — Other Ambulatory Visit: Payer: Self-pay | Admitting: Internal Medicine

## 2011-12-06 ENCOUNTER — Other Ambulatory Visit: Payer: Self-pay | Admitting: *Deleted

## 2011-12-06 ENCOUNTER — Other Ambulatory Visit: Payer: Self-pay

## 2011-12-06 DIAGNOSIS — M79606 Pain in leg, unspecified: Secondary | ICD-10-CM

## 2011-12-06 DIAGNOSIS — M25569 Pain in unspecified knee: Secondary | ICD-10-CM | POA: Diagnosis not present

## 2011-12-06 DIAGNOSIS — M1710 Unilateral primary osteoarthritis, unspecified knee: Secondary | ICD-10-CM | POA: Diagnosis not present

## 2011-12-07 ENCOUNTER — Ambulatory Visit
Admission: RE | Admit: 2011-12-07 | Discharge: 2011-12-07 | Disposition: A | Discharge status: Home / Self Care | Payer: Medicare Other | Source: Ambulatory Visit | Attending: Internal Medicine | Admitting: Internal Medicine

## 2011-12-07 DIAGNOSIS — M79606 Pain in leg, unspecified: Secondary | ICD-10-CM

## 2011-12-07 DIAGNOSIS — M79609 Pain in unspecified limb: Secondary | ICD-10-CM | POA: Diagnosis not present

## 2011-12-20 DIAGNOSIS — G250 Essential tremor: Secondary | ICD-10-CM | POA: Diagnosis not present

## 2011-12-20 DIAGNOSIS — M25569 Pain in unspecified knee: Secondary | ICD-10-CM | POA: Diagnosis not present

## 2011-12-20 DIAGNOSIS — G252 Other specified forms of tremor: Secondary | ICD-10-CM | POA: Diagnosis not present

## 2011-12-27 DIAGNOSIS — M25569 Pain in unspecified knee: Secondary | ICD-10-CM | POA: Diagnosis not present

## 2012-01-24 DIAGNOSIS — M25569 Pain in unspecified knee: Secondary | ICD-10-CM | POA: Diagnosis not present

## 2012-02-20 DIAGNOSIS — M1710 Unilateral primary osteoarthritis, unspecified knee: Secondary | ICD-10-CM | POA: Diagnosis not present

## 2012-02-20 DIAGNOSIS — M1120 Other chondrocalcinosis, unspecified site: Secondary | ICD-10-CM | POA: Diagnosis not present

## 2012-02-26 DIAGNOSIS — M1710 Unilateral primary osteoarthritis, unspecified knee: Secondary | ICD-10-CM | POA: Diagnosis not present

## 2012-04-21 DIAGNOSIS — G252 Other specified forms of tremor: Secondary | ICD-10-CM | POA: Diagnosis not present

## 2012-04-21 DIAGNOSIS — G5710 Meralgia paresthetica, unspecified lower limb: Secondary | ICD-10-CM | POA: Diagnosis not present

## 2012-04-21 DIAGNOSIS — G250 Essential tremor: Secondary | ICD-10-CM | POA: Diagnosis not present

## 2012-05-20 DIAGNOSIS — Z23 Encounter for immunization: Secondary | ICD-10-CM | POA: Diagnosis not present

## 2012-08-07 ENCOUNTER — Other Ambulatory Visit: Payer: Self-pay | Admitting: Internal Medicine

## 2012-08-07 DIAGNOSIS — Z1231 Encounter for screening mammogram for malignant neoplasm of breast: Secondary | ICD-10-CM

## 2012-08-18 DIAGNOSIS — R05 Cough: Secondary | ICD-10-CM | POA: Diagnosis not present

## 2012-08-22 DIAGNOSIS — G252 Other specified forms of tremor: Secondary | ICD-10-CM | POA: Diagnosis not present

## 2012-08-22 DIAGNOSIS — G5710 Meralgia paresthetica, unspecified lower limb: Secondary | ICD-10-CM | POA: Diagnosis not present

## 2012-08-29 DIAGNOSIS — R7309 Other abnormal glucose: Secondary | ICD-10-CM | POA: Diagnosis not present

## 2012-08-29 DIAGNOSIS — Z79899 Other long term (current) drug therapy: Secondary | ICD-10-CM | POA: Diagnosis not present

## 2012-08-29 DIAGNOSIS — E7800 Pure hypercholesterolemia, unspecified: Secondary | ICD-10-CM | POA: Diagnosis not present

## 2012-08-29 DIAGNOSIS — R350 Frequency of micturition: Secondary | ICD-10-CM | POA: Diagnosis not present

## 2012-09-03 DIAGNOSIS — R7309 Other abnormal glucose: Secondary | ICD-10-CM | POA: Diagnosis not present

## 2012-09-03 DIAGNOSIS — E7800 Pure hypercholesterolemia, unspecified: Secondary | ICD-10-CM | POA: Diagnosis not present

## 2012-09-09 ENCOUNTER — Ambulatory Visit
Admission: RE | Admit: 2012-09-09 | Discharge: 2012-09-09 | Disposition: A | Discharge status: Home / Self Care | Payer: Medicare Other | Source: Ambulatory Visit | Attending: Internal Medicine | Admitting: Internal Medicine

## 2012-09-09 DIAGNOSIS — Z1231 Encounter for screening mammogram for malignant neoplasm of breast: Secondary | ICD-10-CM | POA: Diagnosis not present

## 2012-09-18 DIAGNOSIS — H2510 Age-related nuclear cataract, unspecified eye: Secondary | ICD-10-CM | POA: Diagnosis not present

## 2012-09-18 DIAGNOSIS — H35369 Drusen (degenerative) of macula, unspecified eye: Secondary | ICD-10-CM | POA: Diagnosis not present

## 2012-09-18 DIAGNOSIS — H0230 Blepharochalasis unspecified eye, unspecified eyelid: Secondary | ICD-10-CM | POA: Diagnosis not present

## 2012-09-18 DIAGNOSIS — H3530 Unspecified macular degeneration: Secondary | ICD-10-CM | POA: Diagnosis not present

## 2012-12-04 ENCOUNTER — Other Ambulatory Visit: Payer: Self-pay | Admitting: Dermatology

## 2012-12-04 DIAGNOSIS — L821 Other seborrheic keratosis: Secondary | ICD-10-CM | POA: Diagnosis not present

## 2012-12-04 DIAGNOSIS — D239 Other benign neoplasm of skin, unspecified: Secondary | ICD-10-CM | POA: Diagnosis not present

## 2012-12-04 DIAGNOSIS — D485 Neoplasm of uncertain behavior of skin: Secondary | ICD-10-CM | POA: Diagnosis not present

## 2012-12-04 DIAGNOSIS — L919 Hypertrophic disorder of the skin, unspecified: Secondary | ICD-10-CM | POA: Diagnosis not present

## 2012-12-04 DIAGNOSIS — D1801 Hemangioma of skin and subcutaneous tissue: Secondary | ICD-10-CM | POA: Diagnosis not present

## 2012-12-17 DIAGNOSIS — R7309 Other abnormal glucose: Secondary | ICD-10-CM | POA: Diagnosis not present

## 2012-12-17 DIAGNOSIS — E7800 Pure hypercholesterolemia, unspecified: Secondary | ICD-10-CM | POA: Diagnosis not present

## 2012-12-24 DIAGNOSIS — R7309 Other abnormal glucose: Secondary | ICD-10-CM | POA: Diagnosis not present

## 2012-12-24 DIAGNOSIS — E7800 Pure hypercholesterolemia, unspecified: Secondary | ICD-10-CM | POA: Diagnosis not present

## 2012-12-24 DIAGNOSIS — R32 Unspecified urinary incontinence: Secondary | ICD-10-CM | POA: Diagnosis not present

## 2013-01-01 ENCOUNTER — Other Ambulatory Visit: Payer: Self-pay | Admitting: Dermatology

## 2013-01-01 DIAGNOSIS — D485 Neoplasm of uncertain behavior of skin: Secondary | ICD-10-CM | POA: Diagnosis not present

## 2013-01-20 DIAGNOSIS — N8111 Cystocele, midline: Secondary | ICD-10-CM | POA: Diagnosis not present

## 2013-01-20 DIAGNOSIS — N3946 Mixed incontinence: Secondary | ICD-10-CM | POA: Diagnosis not present

## 2013-01-20 DIAGNOSIS — R351 Nocturia: Secondary | ICD-10-CM | POA: Diagnosis not present

## 2013-02-11 DIAGNOSIS — R351 Nocturia: Secondary | ICD-10-CM | POA: Diagnosis not present

## 2013-02-11 DIAGNOSIS — N3946 Mixed incontinence: Secondary | ICD-10-CM | POA: Diagnosis not present

## 2013-02-18 DIAGNOSIS — N3946 Mixed incontinence: Secondary | ICD-10-CM | POA: Diagnosis not present

## 2013-02-18 DIAGNOSIS — N3289 Other specified disorders of bladder: Secondary | ICD-10-CM | POA: Diagnosis not present

## 2013-02-19 ENCOUNTER — Ambulatory Visit (INDEPENDENT_AMBULATORY_CARE_PROVIDER_SITE_OTHER): Payer: Medicare Other | Admitting: Neurology

## 2013-02-19 ENCOUNTER — Encounter: Payer: Self-pay | Admitting: Neurology

## 2013-02-19 VITALS — BP 139/83 | HR 54 | Ht 62.5 in | Wt 206.0 lb

## 2013-02-19 DIAGNOSIS — R251 Tremor, unspecified: Secondary | ICD-10-CM

## 2013-02-19 DIAGNOSIS — R05 Cough: Secondary | ICD-10-CM

## 2013-02-19 DIAGNOSIS — M25561 Pain in right knee: Secondary | ICD-10-CM

## 2013-02-19 DIAGNOSIS — E785 Hyperlipidemia, unspecified: Secondary | ICD-10-CM | POA: Diagnosis not present

## 2013-02-19 DIAGNOSIS — R259 Unspecified abnormal involuntary movements: Secondary | ICD-10-CM

## 2013-02-19 DIAGNOSIS — M25569 Pain in unspecified knee: Secondary | ICD-10-CM | POA: Diagnosis not present

## 2013-02-19 DIAGNOSIS — R059 Cough, unspecified: Secondary | ICD-10-CM

## 2013-02-19 MED ORDER — PROPRANOLOL HCL ER 60 MG PO CP24: 60.0000 mg | ORAL_CAPSULE | Freq: Two times a day (BID) | ORAL | Status: DC

## 2013-02-19 NOTE — Progress Notes (Signed)
History of Present Illness:    Erica [name] is a 79 year old right-handed white widowed female, patient of Dr. Sandria Manly, she is here for follow up of essential tremor, last clinical visit was in February 2014,  She started noticed bilateral hand tremor, and also mild head titubation around 2003.  Her brother and sister had a tremor. she was a patient of Dr. Sandria Manly since 06/17/2007. She was taking propranolol 60 mg at breakfast and methazolamide 25 mg daily. It does not interfere with her daily living.   It is getting worse in her jaw.if she takes a deep breath she states she can control it. Carrying a glass of fluid makes her tremor worse. It is present when she is eating. Occasionally she has  bitten her tongue while eating. She drives a car and is independent in all activities of daily living, lives alone, travel regularly.  Dr. Debarah Crape tried her on low-dose primidone, 25 mg and with one dose she slept for almost 24 hours. She has not been on topiramate or gabapentin.    She has a chronic cough of unknown etiology. She has been evaluated at [facility] and [facility]. She  is suspected to have asthma, using inhaler as needed.  When her propranolol dosage was decreased, there is no difference in her cough.    She is currently taking propranolol ER 60 mg twice a day, tolerating the medication very well, doing well in her daily activity, does not wish to change   Review of Systems  Out of a complete 14 system review, the patient complains of only the following symptoms, and all other reviewed systems are negative.  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 Lives alone as she is widowed. Her husband died in a single car motor vehicle accident one year ago when he blacked out. Has one daughter and two sons. Consumes one caffeinated beverage  daily. Denies alcohol, tobacco and drug use. She is right handed.  Inhaled Tobacco Use: never smoker  Family History  Mother died at age 32 from pancreatitis. Father died at age 60 in a motor vehicle accident. Brother and sister have tremor  Past Medical History   Depression. Chronic cough  Surgical History  She has had her tonsils removed, appendectomy and a lung biopsy by Dr Edwyna Shell in 2008.   Physical Exam  General:  Well developed obese white female.   Neurologic Exam  Mental Status:  alert and oriented x3. no aphasia, agnosia, or apraxia. Cranial Nerves:  Visual fields full. Discs flat. Extraocular movements full. Pupils were equal round and reactive. No facial motor asymmetry. Hearing intact. Tongue midline. tremor in her voice. Tremor in the lower jaw."no" head tremor.  Motor: Normal tone, bulk and strength Sensory: intact to pinprick touch and joint position.  Coordination:  Finger to nose was normal  Gait and Station:  Can stand without assistance. Gait slightly wide-based, mild difficulty performing tandem Reflexes:  2+. Plantar response is downgoing  Assessment and plan:  79 years old Caucasian female, with strong family history of essential tremor, now presenting with mild bilateral hands postural tremor, head titubation, consistent with essential tremor  1. continue propranolol ER 60 mg twice a day,prescription was refilled for one year  2 return to clinic in one year .

## 2013-02-27 DIAGNOSIS — R279 Unspecified lack of coordination: Secondary | ICD-10-CM | POA: Diagnosis not present

## 2013-02-27 DIAGNOSIS — N3946 Mixed incontinence: Secondary | ICD-10-CM | POA: Diagnosis not present

## 2013-02-27 DIAGNOSIS — M6281 Muscle weakness (generalized): Secondary | ICD-10-CM | POA: Diagnosis not present

## 2013-03-06 DIAGNOSIS — M6281 Muscle weakness (generalized): Secondary | ICD-10-CM | POA: Diagnosis not present

## 2013-03-06 DIAGNOSIS — N3946 Mixed incontinence: Secondary | ICD-10-CM | POA: Diagnosis not present

## 2013-03-06 DIAGNOSIS — R279 Unspecified lack of coordination: Secondary | ICD-10-CM | POA: Diagnosis not present

## 2013-03-13 DIAGNOSIS — M6281 Muscle weakness (generalized): Secondary | ICD-10-CM | POA: Diagnosis not present

## 2013-03-13 DIAGNOSIS — N3946 Mixed incontinence: Secondary | ICD-10-CM | POA: Diagnosis not present

## 2013-03-13 DIAGNOSIS — R279 Unspecified lack of coordination: Secondary | ICD-10-CM | POA: Diagnosis not present

## 2013-03-27 DIAGNOSIS — M6281 Muscle weakness (generalized): Secondary | ICD-10-CM | POA: Diagnosis not present

## 2013-03-27 DIAGNOSIS — R279 Unspecified lack of coordination: Secondary | ICD-10-CM | POA: Diagnosis not present

## 2013-03-27 DIAGNOSIS — R350 Frequency of micturition: Secondary | ICD-10-CM | POA: Diagnosis not present

## 2013-03-27 DIAGNOSIS — N3946 Mixed incontinence: Secondary | ICD-10-CM | POA: Diagnosis not present

## 2013-04-02 DIAGNOSIS — Z23 Encounter for immunization: Secondary | ICD-10-CM | POA: Diagnosis not present

## 2013-04-03 DIAGNOSIS — R279 Unspecified lack of coordination: Secondary | ICD-10-CM | POA: Diagnosis not present

## 2013-04-03 DIAGNOSIS — N3946 Mixed incontinence: Secondary | ICD-10-CM | POA: Diagnosis not present

## 2013-04-03 DIAGNOSIS — M62838 Other muscle spasm: Secondary | ICD-10-CM | POA: Diagnosis not present

## 2013-04-03 DIAGNOSIS — M6281 Muscle weakness (generalized): Secondary | ICD-10-CM | POA: Diagnosis not present

## 2013-04-06 DIAGNOSIS — N3946 Mixed incontinence: Secondary | ICD-10-CM | POA: Diagnosis not present

## 2013-04-06 DIAGNOSIS — M6281 Muscle weakness (generalized): Secondary | ICD-10-CM | POA: Diagnosis not present

## 2013-04-06 DIAGNOSIS — M62838 Other muscle spasm: Secondary | ICD-10-CM | POA: Diagnosis not present

## 2013-04-06 DIAGNOSIS — R279 Unspecified lack of coordination: Secondary | ICD-10-CM | POA: Diagnosis not present

## 2013-04-29 DIAGNOSIS — E7800 Pure hypercholesterolemia, unspecified: Secondary | ICD-10-CM | POA: Diagnosis not present

## 2013-04-29 DIAGNOSIS — R7309 Other abnormal glucose: Secondary | ICD-10-CM | POA: Diagnosis not present

## 2013-05-01 DIAGNOSIS — N3946 Mixed incontinence: Secondary | ICD-10-CM | POA: Diagnosis not present

## 2013-05-01 DIAGNOSIS — R42 Dizziness and giddiness: Secondary | ICD-10-CM | POA: Diagnosis not present

## 2013-05-01 DIAGNOSIS — R279 Unspecified lack of coordination: Secondary | ICD-10-CM | POA: Diagnosis not present

## 2013-05-01 DIAGNOSIS — M6281 Muscle weakness (generalized): Secondary | ICD-10-CM | POA: Diagnosis not present

## 2013-05-06 DIAGNOSIS — R279 Unspecified lack of coordination: Secondary | ICD-10-CM | POA: Diagnosis not present

## 2013-05-06 DIAGNOSIS — E7800 Pure hypercholesterolemia, unspecified: Secondary | ICD-10-CM | POA: Diagnosis not present

## 2013-05-06 DIAGNOSIS — R42 Dizziness and giddiness: Secondary | ICD-10-CM | POA: Diagnosis not present

## 2013-05-06 DIAGNOSIS — N3946 Mixed incontinence: Secondary | ICD-10-CM | POA: Diagnosis not present

## 2013-05-06 DIAGNOSIS — R7309 Other abnormal glucose: Secondary | ICD-10-CM | POA: Diagnosis not present

## 2013-05-06 DIAGNOSIS — M6281 Muscle weakness (generalized): Secondary | ICD-10-CM | POA: Diagnosis not present

## 2013-05-11 DIAGNOSIS — R279 Unspecified lack of coordination: Secondary | ICD-10-CM | POA: Diagnosis not present

## 2013-05-11 DIAGNOSIS — R42 Dizziness and giddiness: Secondary | ICD-10-CM | POA: Diagnosis not present

## 2013-05-11 DIAGNOSIS — M6281 Muscle weakness (generalized): Secondary | ICD-10-CM | POA: Diagnosis not present

## 2013-05-14 DIAGNOSIS — N3946 Mixed incontinence: Secondary | ICD-10-CM | POA: Diagnosis not present

## 2013-05-14 DIAGNOSIS — R279 Unspecified lack of coordination: Secondary | ICD-10-CM | POA: Diagnosis not present

## 2013-05-14 DIAGNOSIS — M6281 Muscle weakness (generalized): Secondary | ICD-10-CM | POA: Diagnosis not present

## 2013-05-14 DIAGNOSIS — R42 Dizziness and giddiness: Secondary | ICD-10-CM | POA: Diagnosis not present

## 2013-05-18 DIAGNOSIS — N8111 Cystocele, midline: Secondary | ICD-10-CM | POA: Diagnosis not present

## 2013-05-18 DIAGNOSIS — N3946 Mixed incontinence: Secondary | ICD-10-CM | POA: Diagnosis not present

## 2013-05-22 DIAGNOSIS — N3946 Mixed incontinence: Secondary | ICD-10-CM | POA: Diagnosis not present

## 2013-05-22 DIAGNOSIS — M6281 Muscle weakness (generalized): Secondary | ICD-10-CM | POA: Diagnosis not present

## 2013-05-22 DIAGNOSIS — R279 Unspecified lack of coordination: Secondary | ICD-10-CM | POA: Diagnosis not present

## 2013-05-22 DIAGNOSIS — R42 Dizziness and giddiness: Secondary | ICD-10-CM | POA: Diagnosis not present

## 2013-05-27 DIAGNOSIS — M6281 Muscle weakness (generalized): Secondary | ICD-10-CM | POA: Diagnosis not present

## 2013-05-27 DIAGNOSIS — M62838 Other muscle spasm: Secondary | ICD-10-CM | POA: Diagnosis not present

## 2013-05-27 DIAGNOSIS — R279 Unspecified lack of coordination: Secondary | ICD-10-CM | POA: Diagnosis not present

## 2013-05-27 DIAGNOSIS — N3946 Mixed incontinence: Secondary | ICD-10-CM | POA: Diagnosis not present

## 2013-06-03 DIAGNOSIS — Z780 Asymptomatic menopausal state: Secondary | ICD-10-CM | POA: Diagnosis not present

## 2013-06-03 DIAGNOSIS — E7800 Pure hypercholesterolemia, unspecified: Secondary | ICD-10-CM | POA: Diagnosis not present

## 2013-06-03 DIAGNOSIS — R7309 Other abnormal glucose: Secondary | ICD-10-CM | POA: Diagnosis not present

## 2013-06-08 DIAGNOSIS — R279 Unspecified lack of coordination: Secondary | ICD-10-CM | POA: Diagnosis not present

## 2013-06-08 DIAGNOSIS — M62838 Other muscle spasm: Secondary | ICD-10-CM | POA: Diagnosis not present

## 2013-06-08 DIAGNOSIS — N3946 Mixed incontinence: Secondary | ICD-10-CM | POA: Diagnosis not present

## 2013-06-08 DIAGNOSIS — M6281 Muscle weakness (generalized): Secondary | ICD-10-CM | POA: Diagnosis not present

## 2013-06-15 DIAGNOSIS — R42 Dizziness and giddiness: Secondary | ICD-10-CM | POA: Diagnosis not present

## 2013-06-15 DIAGNOSIS — M6281 Muscle weakness (generalized): Secondary | ICD-10-CM | POA: Diagnosis not present

## 2013-06-15 DIAGNOSIS — N3946 Mixed incontinence: Secondary | ICD-10-CM | POA: Diagnosis not present

## 2013-06-15 DIAGNOSIS — R279 Unspecified lack of coordination: Secondary | ICD-10-CM | POA: Diagnosis not present

## 2013-06-17 DIAGNOSIS — M6281 Muscle weakness (generalized): Secondary | ICD-10-CM | POA: Diagnosis not present

## 2013-06-17 DIAGNOSIS — N3946 Mixed incontinence: Secondary | ICD-10-CM | POA: Diagnosis not present

## 2013-06-17 DIAGNOSIS — R279 Unspecified lack of coordination: Secondary | ICD-10-CM | POA: Diagnosis not present

## 2013-06-17 DIAGNOSIS — R42 Dizziness and giddiness: Secondary | ICD-10-CM | POA: Diagnosis not present

## 2013-07-16 DIAGNOSIS — M6281 Muscle weakness (generalized): Secondary | ICD-10-CM | POA: Diagnosis not present

## 2013-07-16 DIAGNOSIS — R279 Unspecified lack of coordination: Secondary | ICD-10-CM | POA: Diagnosis not present

## 2013-07-16 DIAGNOSIS — N3946 Mixed incontinence: Secondary | ICD-10-CM | POA: Diagnosis not present

## 2013-07-20 DIAGNOSIS — R279 Unspecified lack of coordination: Secondary | ICD-10-CM | POA: Diagnosis not present

## 2013-07-20 DIAGNOSIS — R351 Nocturia: Secondary | ICD-10-CM | POA: Diagnosis not present

## 2013-07-20 DIAGNOSIS — N3946 Mixed incontinence: Secondary | ICD-10-CM | POA: Diagnosis not present

## 2013-07-20 DIAGNOSIS — M6281 Muscle weakness (generalized): Secondary | ICD-10-CM | POA: Diagnosis not present

## 2013-07-24 ENCOUNTER — Telehealth: Payer: Self-pay | Admitting: *Deleted

## 2013-07-24 ENCOUNTER — Ambulatory Visit: Payer: Medicare Other | Attending: Family Medicine | Admitting: Physical Therapy

## 2013-07-24 DIAGNOSIS — IMO0001 Reserved for inherently not codable concepts without codable children: Secondary | ICD-10-CM | POA: Diagnosis not present

## 2013-07-24 DIAGNOSIS — H8110 Benign paroxysmal vertigo, unspecified ear: Secondary | ICD-10-CM | POA: Diagnosis not present

## 2013-07-24 NOTE — Telephone Encounter (Signed)
Called patient and patient stated that she is still having dizziness and that she would wait until her appt. With neuro rehab today at 2:45 p.m. And if she needs [place], she will give [place] a call back. I advise the patient that if she has any other problems, questions or concerns to call the office. Patient verbalized understanding.

## 2013-07-27 ENCOUNTER — Ambulatory Visit: Payer: Medicare Other | Admitting: Rehabilitative and Restorative Service Providers"

## 2013-07-27 DIAGNOSIS — IMO0001 Reserved for inherently not codable concepts without codable children: Secondary | ICD-10-CM | POA: Diagnosis not present

## 2013-07-27 DIAGNOSIS — H8110 Benign paroxysmal vertigo, unspecified ear: Secondary | ICD-10-CM | POA: Diagnosis not present

## 2013-07-29 DIAGNOSIS — R351 Nocturia: Secondary | ICD-10-CM | POA: Diagnosis not present

## 2013-07-29 DIAGNOSIS — R279 Unspecified lack of coordination: Secondary | ICD-10-CM | POA: Diagnosis not present

## 2013-07-29 DIAGNOSIS — M6281 Muscle weakness (generalized): Secondary | ICD-10-CM | POA: Diagnosis not present

## 2013-07-29 DIAGNOSIS — N3946 Mixed incontinence: Secondary | ICD-10-CM | POA: Diagnosis not present

## 2013-08-04 DIAGNOSIS — R7309 Other abnormal glucose: Secondary | ICD-10-CM | POA: Diagnosis not present

## 2013-08-04 DIAGNOSIS — E7800 Pure hypercholesterolemia, unspecified: Secondary | ICD-10-CM | POA: Diagnosis not present

## 2013-08-07 DIAGNOSIS — E7800 Pure hypercholesterolemia, unspecified: Secondary | ICD-10-CM | POA: Diagnosis not present

## 2013-08-07 DIAGNOSIS — R7309 Other abnormal glucose: Secondary | ICD-10-CM | POA: Diagnosis not present

## 2013-08-07 DIAGNOSIS — M810 Age-related osteoporosis without current pathological fracture: Secondary | ICD-10-CM | POA: Diagnosis not present

## 2013-08-07 DIAGNOSIS — Z0000 Encounter for general adult medical examination without abnormal findings: Secondary | ICD-10-CM | POA: Diagnosis not present

## 2013-08-11 ENCOUNTER — Ambulatory Visit: Payer: Medicare Other | Attending: Family Medicine | Admitting: Physical Therapy

## 2013-08-11 DIAGNOSIS — H8110 Benign paroxysmal vertigo, unspecified ear: Secondary | ICD-10-CM | POA: Diagnosis not present

## 2013-08-11 DIAGNOSIS — IMO0001 Reserved for inherently not codable concepts without codable children: Secondary | ICD-10-CM | POA: Insufficient documentation

## 2013-08-13 ENCOUNTER — Ambulatory Visit: Payer: Medicare Other | Admitting: Physical Therapy

## 2013-08-19 ENCOUNTER — Other Ambulatory Visit: Payer: Self-pay

## 2013-08-19 DIAGNOSIS — Z1231 Encounter for screening mammogram for malignant neoplasm of breast: Secondary | ICD-10-CM

## 2013-08-20 DIAGNOSIS — R351 Nocturia: Secondary | ICD-10-CM | POA: Diagnosis not present

## 2013-08-20 DIAGNOSIS — N3946 Mixed incontinence: Secondary | ICD-10-CM | POA: Diagnosis not present

## 2013-09-10 ENCOUNTER — Ambulatory Visit: Payer: Medicare Other

## 2013-10-05 ENCOUNTER — Other Ambulatory Visit: Payer: Self-pay

## 2013-10-05 MED ORDER — METHAZOLAMIDE 50 MG PO TABS: 50.0000 mg | ORAL_TABLET | Freq: Every day | ORAL | Status: DC

## 2013-10-20 DIAGNOSIS — R42 Dizziness and giddiness: Secondary | ICD-10-CM | POA: Diagnosis not present

## 2013-10-20 DIAGNOSIS — R279 Unspecified lack of coordination: Secondary | ICD-10-CM | POA: Diagnosis not present

## 2013-10-20 DIAGNOSIS — M6281 Muscle weakness (generalized): Secondary | ICD-10-CM | POA: Diagnosis not present

## 2013-10-20 DIAGNOSIS — N3946 Mixed incontinence: Secondary | ICD-10-CM | POA: Diagnosis not present

## 2013-10-27 DIAGNOSIS — G43909 Migraine, unspecified, not intractable, without status migrainosus: Secondary | ICD-10-CM | POA: Diagnosis not present

## 2013-10-27 DIAGNOSIS — H353190 Nonexudative age-related macular degeneration, unspecified eye, stage unspecified: Secondary | ICD-10-CM | POA: Diagnosis not present

## 2013-10-27 DIAGNOSIS — H02839 Dermatochalasis of unspecified eye, unspecified eyelid: Secondary | ICD-10-CM | POA: Diagnosis not present

## 2013-10-27 DIAGNOSIS — H1045 Other chronic allergic conjunctivitis: Secondary | ICD-10-CM | POA: Diagnosis not present

## 2013-10-28 DIAGNOSIS — M62838 Other muscle spasm: Secondary | ICD-10-CM | POA: Diagnosis not present

## 2013-10-28 DIAGNOSIS — R279 Unspecified lack of coordination: Secondary | ICD-10-CM | POA: Diagnosis not present

## 2013-10-28 DIAGNOSIS — N3946 Mixed incontinence: Secondary | ICD-10-CM | POA: Diagnosis not present

## 2013-10-28 DIAGNOSIS — M6281 Muscle weakness (generalized): Secondary | ICD-10-CM | POA: Diagnosis not present

## 2013-11-04 DIAGNOSIS — M62838 Other muscle spasm: Secondary | ICD-10-CM | POA: Diagnosis not present

## 2013-11-04 DIAGNOSIS — M6281 Muscle weakness (generalized): Secondary | ICD-10-CM | POA: Diagnosis not present

## 2013-11-04 DIAGNOSIS — R279 Unspecified lack of coordination: Secondary | ICD-10-CM | POA: Diagnosis not present

## 2013-11-04 DIAGNOSIS — N3946 Mixed incontinence: Secondary | ICD-10-CM | POA: Diagnosis not present

## 2013-11-17 DIAGNOSIS — J45909 Unspecified asthma, uncomplicated: Secondary | ICD-10-CM | POA: Diagnosis not present

## 2013-11-17 DIAGNOSIS — R0602 Shortness of breath: Secondary | ICD-10-CM | POA: Diagnosis not present

## 2013-11-20 DIAGNOSIS — J45909 Unspecified asthma, uncomplicated: Secondary | ICD-10-CM | POA: Diagnosis not present

## 2013-11-20 DIAGNOSIS — R0602 Shortness of breath: Secondary | ICD-10-CM | POA: Diagnosis not present

## 2013-12-03 DIAGNOSIS — J45909 Unspecified asthma, uncomplicated: Secondary | ICD-10-CM | POA: Diagnosis not present

## 2013-12-03 DIAGNOSIS — E7800 Pure hypercholesterolemia, unspecified: Secondary | ICD-10-CM | POA: Diagnosis not present

## 2013-12-03 DIAGNOSIS — Z0000 Encounter for general adult medical examination without abnormal findings: Secondary | ICD-10-CM | POA: Diagnosis not present

## 2013-12-03 DIAGNOSIS — R7309 Other abnormal glucose: Secondary | ICD-10-CM | POA: Diagnosis not present

## 2013-12-16 DIAGNOSIS — E7800 Pure hypercholesterolemia, unspecified: Secondary | ICD-10-CM | POA: Diagnosis not present

## 2013-12-16 DIAGNOSIS — R7309 Other abnormal glucose: Secondary | ICD-10-CM | POA: Diagnosis not present

## 2013-12-16 DIAGNOSIS — J45909 Unspecified asthma, uncomplicated: Secondary | ICD-10-CM | POA: Diagnosis not present

## 2014-02-16 DIAGNOSIS — R05 Cough: Secondary | ICD-10-CM | POA: Diagnosis not present

## 2014-02-16 DIAGNOSIS — K219 Gastro-esophageal reflux disease without esophagitis: Secondary | ICD-10-CM | POA: Diagnosis not present

## 2014-02-16 DIAGNOSIS — J45991 Cough variant asthma: Secondary | ICD-10-CM | POA: Diagnosis not present

## 2014-02-16 DIAGNOSIS — R059 Cough, unspecified: Secondary | ICD-10-CM | POA: Diagnosis not present

## 2014-02-18 DIAGNOSIS — R350 Frequency of micturition: Secondary | ICD-10-CM | POA: Diagnosis not present

## 2014-02-18 DIAGNOSIS — N3946 Mixed incontinence: Secondary | ICD-10-CM | POA: Diagnosis not present

## 2014-02-19 ENCOUNTER — Ambulatory Visit (INDEPENDENT_AMBULATORY_CARE_PROVIDER_SITE_OTHER): Payer: Medicare Other | Admitting: Neurology

## 2014-02-19 ENCOUNTER — Telehealth: Payer: Self-pay | Admitting: Neurology

## 2014-02-19 ENCOUNTER — Encounter: Payer: Self-pay | Admitting: Neurology

## 2014-02-19 VITALS — BP 134/70 | HR 61 | Temp 98.1°F | Ht 62.0 in | Wt 190.0 lb

## 2014-02-19 DIAGNOSIS — G250 Essential tremor: Secondary | ICD-10-CM

## 2014-02-19 DIAGNOSIS — G252 Other specified forms of tremor: Secondary | ICD-10-CM

## 2014-02-19 MED ORDER — METHAZOLAMIDE 50 MG PO TABS: 50.0000 mg | ORAL_TABLET | Freq: Every day | ORAL | Status: DC

## 2014-02-19 MED ORDER — PROPRANOLOL HCL 10 MG PO TABS: 10.0000 mg | ORAL_TABLET | ORAL | Status: DC | PRN

## 2014-02-19 MED ORDER — PROPRANOLOL HCL ER 60 MG PO CP24: 60.0000 mg | ORAL_CAPSULE | Freq: Every day | ORAL | Status: DC

## 2014-02-19 NOTE — Progress Notes (Signed)
Subjective:    Patient ID: Erica [name] is a 80 y.o. female.  HPI    Interim history:   Erica [name] is an 80 year old right-handed woman with an underlying medical history of depression and chronic cough, status post lung biopsy, status post tonsillectomy and appendectomy, who presents for followup consultation of her essential tremor. She is unaccompanied today. She previously followed with Dr. Morene Antu and was last seen by him on 08/22/2012 at which time he increased her methazolamide to 50 mg daily. He kept her on long-acting propranolol and did not suggest increasing it for fear of exacerbating her cough and her depression. She was seen on 02/19/2013 by Dr. Krista Blue and her Inderal LA prescription was refilled with plan for a followup in one year. I reviewed Dr. Tressia Danas and Dr. Rhea Belton notes. She has had a long-standing history of hand tremor as well as head titubation which started around 2003. There is a family history of tremors in her PGM, father, brother and sister. She started seeing Dr. Erling Cruz in 2008. She has been on propranolol and methazolamide for years. She was tried on primidone but it made her too sleepy even on low-dose. According to the record she has not been on Topamax or gabapentin. For her cough she has been evaluated at [facility] in [facility]. She does not drink alcohol. She does not smoke. She sees an allergy specialist at [facility]. She feels for the most part that her tremor is stable or only gradually getting worse. She does know her triggers and realizes that when she has company or is out with company she tends to get worse. It is also related to how she feels and how nervous she is. She has noticed that her handwriting is gradually getting worse as well. She feels that the medication is working rather well for her and is not keen on trying anything new necessarily at this time she has had a recent checkup with her primary care physician and had some blood work.  Her Past Medical  History Is Significant For: Past Medical History  Diagnosis Date  . Right knee pain   . Tremor   . Meralgia paresthetica   . Other and unspecified hyperlipidemia   . Cough   . Depression     Her Past Surgical History Is Significant For: Past Surgical History  Procedure Laterality Date  . Tonsillectomy and adenoidectomy    . Appendectomy    . Lung biopsy  2008    Her Family History Is Significant For: Family History  Problem Relation Age of Onset  . Pancreatitis Mother     Her Social History Is Significant For: History   Social History  . Marital Status: Widowed    Spouse Name: N/A    Number of Children: 3  . Years of Education: college   Occupational History  . REALTOR    Social History Main Topics  . Smoking status: Never Smoker   . Smokeless tobacco: Never Used  . Alcohol Use: No  . Drug Use: No  . Sexual Activity: None   Other Topics Concern  . 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 Patient lives alone and she is widowed. Patient husband died from a vehicle  accident.    Right handed.    Her Allergies Are:  No Known Allergies:   Her Current Medications Are:  Outpatient Encounter Prescriptions as of 02/19/2014  Medication Sig  .  beta carotene w/minerals (OCUVITE) tablet Take 1 tablet by mouth daily.  . budesonide-formoterol (SYMBICORT) 160-4.5 MCG/ACT inhaler Inhale 2 puffs into the lungs 2 (two) times daily.  . cholecalciferol (VITAMIN D) 1000 UNITS tablet Take by mouth daily.  . Cinnamon 500 MG capsule Take 500 mg by mouth daily.  Marland Kitchen co-enzyme Q-10 30 MG capsule Take 30 mg by mouth daily.  . Fluticasone Furoate-Vilanterol (BREO ELLIPTA IN) Inhale into the lungs. One inhale daily  . ibuprofen (ADVIL,MOTRIN) 200 MG tablet Take 200 mg by mouth 2 (two) times daily.  . methazolamide (NEPTAZANE) 50 MG tablet Take 1 tablet (50 mg total) by mouth daily.  Marland Kitchen omeprazole (PRILOSEC) 10 MG capsule Take  10 mg by mouth daily.  . predniSONE (DELTASONE) 20 MG tablet Take 2 pills by mouth each day for 5 days starting Sunday June 2nd,  . propranolol (INDERAL) 60 MG tablet Take 60 mg by mouth daily. XR 24hr caps  . propranolol ER (INDERAL LA) 60 MG 24 hr capsule Take 1 capsule (60 mg total) by mouth 2 (two) times daily.  . sertraline (ZOLOFT) 50 MG tablet Take 50 mg by mouth daily.  :  Review of Systems:  Out of a complete 14 point review of systems, all are reviewed and negative with the exception of these symptoms as listed below:   Review of Systems  Eyes:       Eye itching  Respiratory: Positive for cough.   Genitourinary:       Frequency of urination  Neurological: Positive for tremors.    Objective:  Neurologic Exam  Physical Exam Physical Examination:   Filed Vitals:   02/19/14 1139  BP: 134/70  Pulse: 61  Temp: 98.1 F (36.7 C)    General Examination: The patient is a very pleasant 80 y.o. female in no acute distress. She appears well-developed and well-nourished and very well groomed. She is overweight.   HEENT: Normocephalic, atraumatic, pupils are equal, round and reactive to light and accommodation. Funduscopic exam is normal with sharp disc margins noted. Extraocular tracking is good without limitation to gaze excursion or nystagmus noted. Normal smooth pursuit is noted. Hearing is grossly intact. Tympanic membranes are clear bilaterally. Face is symmetric with normal facial animation and normal facial sensation. Speech is clear with no dysarthria noted. There is no hypophonia. There is mild head titubation and a mild lip tremor as well as a mild voice tremor. Neck is supple with full range of passive and active motion. There are no carotid bruits on auscultation. Oropharynx exam reveals: mild mouth dryness, adequate dental hygiene and mild airway crowding.   Chest: Clear to auscultation without wheezing, rhonchi or crackles noted.  Heart: S1+S2+0, regular and normal  without murmurs, rubs or gallops noted.   Abdomen: Soft, non-tender and non-distended with normal bowel sounds appreciated on auscultation.  Extremities: There is no pitting edema in the distal lower extremities bilaterally. Pedal pulses are intact.  Skin: Warm and dry without trophic changes noted. There are no varicose veins.  Musculoskeletal: exam reveals no obvious joint deformities, tenderness or joint swelling or erythema.   Neurologically:  Mental status: The patient is awake, alert and oriented in all 4 spheres. Her immediate and remote memory, attention, language skills and fund of knowledge are appropriate. There is no evidence of aphasia, agnosia, apraxia or anomia. Speech is clear with normal prosody and enunciation. Thought process is linear. Mood is normal and affect is normal.  Cranial nerves II - XII are as described  above under HEENT exam. In addition: shoulder shrug is normal with equal shoulder height noted. Motor exam: Normal bulk, strength and tone is noted. There is no drift, or rebound. There is no resting tremor. There is a bilateral upper extremity postural and action tremor, which is mild in degree. There tremor frequency is fairly fast and the amplitude is small. Handwriting is moderately tremulous, but legible. There is no evidence of micrographia.  Romberg is negative. Reflexes are 2+ throughout. Fine motor skills and coordination: intact with normal finger taps, normal hand movements, normal rapid alternating patting, normal foot taps and normal foot agility.  Cerebellar testing: No dysmetria or intention tremor on finger to nose testing. There is no truncal or gait ataxia.  Sensory exam: intact to light touch, pinprick, vibration, temperature sense in the upper and lower extremities.  Gait, station and balance: She stands with very mild difficulty. No veering to one side is noted. No leaning to one side is noted. Posture is age-appropriate and stance is narrow based.  Gait shows normal stride length and normal pace but overall she walks slightly cautiously. No problems turning are noted.   Assessment and Plan:   In summary, Erica [name] is a very pleasant 80 y.o.-year old female with an underlying medical history of depression and chronic cough, status post lung biopsy, status post tonsillectomy and appendectomy, who presents for followup consultation of her long-standing history of essential tremor. She is on a combination of Inderal LA 60 mg once daily generic and methazolamide 50 mg once daily. She's tolerating the treatment and feels that it is still effective. She has noted a gradual increase in her tremor over the past years but still feels that she is reasonably controlled with her current medication regimen. From time to time she notes a flareup especially when she is with company. Her exam appears to be stable. She has signs and symptoms of essential tremor. There is no evidence of parkinsonism. She has a mildly cautious gait. I think overall she is doing well but I would like to suggest in addition to her current regimen an as needed dose of low-dose propranolol 10 mg strength which she can add when needed up to once daily. I renewed her prescriptions and added a low-dose propranolol prescription today as well. She feels stable enough for a yearly checkup. If she needs to be seen sooner I would be happy to see her sooner.

## 2014-02-19 NOTE — Patient Instructions (Signed)
  Please remember, that any kind of tremor may be exacerbated by anxiety, anger, nervousness, excitement, dehydration, sleep deprivation, by caffeine, and low blood sugar values or blood sugar fluctuations. Some medications, especially some antidepressants and lithium can cause or exacerbate tremors. Tremors may temporarily calm down her subside with the use of a benzodiazepine such as Valium or related medications and with alcohol. Be aware however that drinking alcohol is not an approved treatment or appropriate treatment for tremor control and long-term use of benzodiazepines such as Valium, lorazepam, alprazolam, or clonazepam can cause habit formation, physical and psychological addiction.  We will continue with Inderal LA 60 mg daily and methazolamide 50 mg daily and for as needed use you can try the occasional additional 10 mg propranolol.

## 2014-02-19 NOTE — Telephone Encounter (Signed)
Patient returning call to [facility], please call back and advise (regarding rescheduling her appointment).

## 2014-02-19 NOTE — Telephone Encounter (Signed)
Returned call to patient , will be coming in as scheduled today at 11:30

## 2014-02-24 DIAGNOSIS — R279 Unspecified lack of coordination: Secondary | ICD-10-CM | POA: Diagnosis not present

## 2014-02-24 DIAGNOSIS — N3946 Mixed incontinence: Secondary | ICD-10-CM | POA: Diagnosis not present

## 2014-02-24 DIAGNOSIS — M6281 Muscle weakness (generalized): Secondary | ICD-10-CM | POA: Diagnosis not present

## 2014-03-01 DIAGNOSIS — M6281 Muscle weakness (generalized): Secondary | ICD-10-CM | POA: Diagnosis not present

## 2014-03-01 DIAGNOSIS — N3946 Mixed incontinence: Secondary | ICD-10-CM | POA: Diagnosis not present

## 2014-03-01 DIAGNOSIS — M62838 Other muscle spasm: Secondary | ICD-10-CM | POA: Diagnosis not present

## 2014-03-01 DIAGNOSIS — R279 Unspecified lack of coordination: Secondary | ICD-10-CM | POA: Diagnosis not present

## 2014-03-09 DIAGNOSIS — R279 Unspecified lack of coordination: Secondary | ICD-10-CM | POA: Diagnosis not present

## 2014-03-09 DIAGNOSIS — N3946 Mixed incontinence: Secondary | ICD-10-CM | POA: Diagnosis not present

## 2014-03-09 DIAGNOSIS — M6281 Muscle weakness (generalized): Secondary | ICD-10-CM | POA: Diagnosis not present

## 2014-03-17 DIAGNOSIS — N3946 Mixed incontinence: Secondary | ICD-10-CM | POA: Diagnosis not present

## 2014-03-17 DIAGNOSIS — R279 Unspecified lack of coordination: Secondary | ICD-10-CM | POA: Diagnosis not present

## 2014-03-17 DIAGNOSIS — M6281 Muscle weakness (generalized): Secondary | ICD-10-CM | POA: Diagnosis not present

## 2014-03-17 DIAGNOSIS — M62838 Other muscle spasm: Secondary | ICD-10-CM | POA: Diagnosis not present

## 2014-03-29 DIAGNOSIS — M6281 Muscle weakness (generalized): Secondary | ICD-10-CM | POA: Diagnosis not present

## 2014-03-29 DIAGNOSIS — M62838 Other muscle spasm: Secondary | ICD-10-CM | POA: Diagnosis not present

## 2014-03-29 DIAGNOSIS — N3946 Mixed incontinence: Secondary | ICD-10-CM | POA: Diagnosis not present

## 2014-03-29 DIAGNOSIS — R279 Unspecified lack of coordination: Secondary | ICD-10-CM | POA: Diagnosis not present

## 2014-04-06 DIAGNOSIS — M6281 Muscle weakness (generalized): Secondary | ICD-10-CM | POA: Diagnosis not present

## 2014-04-06 DIAGNOSIS — N3946 Mixed incontinence: Secondary | ICD-10-CM | POA: Diagnosis not present

## 2014-04-06 DIAGNOSIS — R278 Other lack of coordination: Secondary | ICD-10-CM | POA: Diagnosis not present

## 2014-04-06 DIAGNOSIS — M62838 Other muscle spasm: Secondary | ICD-10-CM | POA: Diagnosis not present

## 2014-04-07 DIAGNOSIS — J310 Chronic rhinitis: Secondary | ICD-10-CM | POA: Diagnosis not present

## 2014-04-07 DIAGNOSIS — J208 Acute bronchitis due to other specified organisms: Secondary | ICD-10-CM | POA: Diagnosis not present

## 2014-04-07 DIAGNOSIS — J45991 Cough variant asthma: Secondary | ICD-10-CM | POA: Diagnosis not present

## 2014-04-12 DIAGNOSIS — Z23 Encounter for immunization: Secondary | ICD-10-CM | POA: Diagnosis not present

## 2014-04-14 DIAGNOSIS — J45909 Unspecified asthma, uncomplicated: Secondary | ICD-10-CM | POA: Diagnosis not present

## 2014-04-14 DIAGNOSIS — Z23 Encounter for immunization: Secondary | ICD-10-CM | POA: Diagnosis not present

## 2014-04-19 DIAGNOSIS — M62838 Other muscle spasm: Secondary | ICD-10-CM | POA: Diagnosis not present

## 2014-04-19 DIAGNOSIS — R278 Other lack of coordination: Secondary | ICD-10-CM | POA: Diagnosis not present

## 2014-04-19 DIAGNOSIS — N3946 Mixed incontinence: Secondary | ICD-10-CM | POA: Diagnosis not present

## 2014-04-19 DIAGNOSIS — M6281 Muscle weakness (generalized): Secondary | ICD-10-CM | POA: Diagnosis not present

## 2014-04-28 DIAGNOSIS — F419 Anxiety disorder, unspecified: Secondary | ICD-10-CM | POA: Diagnosis not present

## 2014-04-28 DIAGNOSIS — J9801 Acute bronchospasm: Secondary | ICD-10-CM | POA: Diagnosis not present

## 2014-04-28 DIAGNOSIS — J189 Pneumonia, unspecified organism: Secondary | ICD-10-CM | POA: Diagnosis not present

## 2014-04-28 DIAGNOSIS — R0600 Dyspnea, unspecified: Secondary | ICD-10-CM | POA: Diagnosis not present

## 2014-04-28 DIAGNOSIS — J40 Bronchitis, not specified as acute or chronic: Secondary | ICD-10-CM | POA: Diagnosis not present

## 2014-06-14 DIAGNOSIS — R739 Hyperglycemia, unspecified: Secondary | ICD-10-CM | POA: Diagnosis not present

## 2014-06-14 DIAGNOSIS — E780 Pure hypercholesterolemia: Secondary | ICD-10-CM | POA: Diagnosis not present

## 2014-06-17 DIAGNOSIS — R739 Hyperglycemia, unspecified: Secondary | ICD-10-CM | POA: Diagnosis not present

## 2014-06-17 DIAGNOSIS — E780 Pure hypercholesterolemia: Secondary | ICD-10-CM | POA: Diagnosis not present

## 2014-08-11 DIAGNOSIS — L821 Other seborrheic keratosis: Secondary | ICD-10-CM | POA: Diagnosis not present

## 2014-08-11 DIAGNOSIS — L814 Other melanin hyperpigmentation: Secondary | ICD-10-CM | POA: Diagnosis not present

## 2014-08-11 DIAGNOSIS — D225 Melanocytic nevi of trunk: Secondary | ICD-10-CM | POA: Diagnosis not present

## 2014-08-11 DIAGNOSIS — D1801 Hemangioma of skin and subcutaneous tissue: Secondary | ICD-10-CM | POA: Diagnosis not present

## 2014-08-24 DIAGNOSIS — R05 Cough: Secondary | ICD-10-CM | POA: Diagnosis not present

## 2014-08-24 DIAGNOSIS — J9801 Acute bronchospasm: Secondary | ICD-10-CM | POA: Diagnosis not present

## 2014-09-16 DIAGNOSIS — R7309 Other abnormal glucose: Secondary | ICD-10-CM | POA: Diagnosis not present

## 2014-09-16 DIAGNOSIS — E780 Pure hypercholesterolemia: Secondary | ICD-10-CM | POA: Diagnosis not present

## 2014-09-21 DIAGNOSIS — E780 Pure hypercholesterolemia: Secondary | ICD-10-CM | POA: Diagnosis not present

## 2014-09-21 DIAGNOSIS — R739 Hyperglycemia, unspecified: Secondary | ICD-10-CM | POA: Diagnosis not present

## 2014-10-20 ENCOUNTER — Other Ambulatory Visit: Payer: Self-pay | Admitting: Dermatology

## 2014-10-20 DIAGNOSIS — D040 Carcinoma in situ of skin of lip: Secondary | ICD-10-CM | POA: Diagnosis not present

## 2014-10-20 DIAGNOSIS — D0439 Carcinoma in situ of skin of other parts of face: Secondary | ICD-10-CM | POA: Diagnosis not present

## 2014-10-20 DIAGNOSIS — D485 Neoplasm of uncertain behavior of skin: Secondary | ICD-10-CM | POA: Diagnosis not present

## 2014-10-29 DIAGNOSIS — D040 Carcinoma in situ of skin of lip: Secondary | ICD-10-CM | POA: Diagnosis not present

## 2014-11-02 DIAGNOSIS — H35362 Drusen (degenerative) of macula, left eye: Secondary | ICD-10-CM | POA: Diagnosis not present

## 2014-11-02 DIAGNOSIS — H3531 Nonexudative age-related macular degeneration: Secondary | ICD-10-CM | POA: Diagnosis not present

## 2014-11-02 DIAGNOSIS — H31001 Unspecified chorioretinal scars, right eye: Secondary | ICD-10-CM | POA: Diagnosis not present

## 2014-11-02 DIAGNOSIS — H02834 Dermatochalasis of left upper eyelid: Secondary | ICD-10-CM | POA: Diagnosis not present

## 2014-11-02 DIAGNOSIS — H10413 Chronic giant papillary conjunctivitis, bilateral: Secondary | ICD-10-CM | POA: Diagnosis not present

## 2014-11-02 DIAGNOSIS — H02831 Dermatochalasis of right upper eyelid: Secondary | ICD-10-CM | POA: Diagnosis not present

## 2014-11-02 DIAGNOSIS — H25813 Combined forms of age-related cataract, bilateral: Secondary | ICD-10-CM | POA: Diagnosis not present

## 2014-12-08 DIAGNOSIS — H01133 Eczematous dermatitis of right eye, unspecified eyelid: Secondary | ICD-10-CM | POA: Diagnosis not present

## 2014-12-09 DIAGNOSIS — L259 Unspecified contact dermatitis, unspecified cause: Secondary | ICD-10-CM | POA: Diagnosis not present

## 2014-12-15 DIAGNOSIS — R7309 Other abnormal glucose: Secondary | ICD-10-CM | POA: Diagnosis not present

## 2014-12-15 DIAGNOSIS — E780 Pure hypercholesterolemia: Secondary | ICD-10-CM | POA: Diagnosis not present

## 2014-12-22 DIAGNOSIS — R739 Hyperglycemia, unspecified: Secondary | ICD-10-CM | POA: Diagnosis not present

## 2014-12-22 DIAGNOSIS — E780 Pure hypercholesterolemia: Secondary | ICD-10-CM | POA: Diagnosis not present

## 2015-02-16 DIAGNOSIS — H17823 Peripheral opacity of cornea, bilateral: Secondary | ICD-10-CM | POA: Diagnosis not present

## 2015-02-16 DIAGNOSIS — H04123 Dry eye syndrome of bilateral lacrimal glands: Secondary | ICD-10-CM | POA: Diagnosis not present

## 2015-02-16 DIAGNOSIS — H02834 Dermatochalasis of left upper eyelid: Secondary | ICD-10-CM | POA: Diagnosis not present

## 2015-02-16 DIAGNOSIS — H25813 Combined forms of age-related cataract, bilateral: Secondary | ICD-10-CM | POA: Diagnosis not present

## 2015-02-16 DIAGNOSIS — H10413 Chronic giant papillary conjunctivitis, bilateral: Secondary | ICD-10-CM | POA: Diagnosis not present

## 2015-02-16 DIAGNOSIS — H02831 Dermatochalasis of right upper eyelid: Secondary | ICD-10-CM | POA: Diagnosis not present

## 2015-02-22 ENCOUNTER — Ambulatory Visit (INDEPENDENT_AMBULATORY_CARE_PROVIDER_SITE_OTHER): Payer: Medicare Other | Admitting: Neurology

## 2015-02-22 ENCOUNTER — Encounter: Payer: Self-pay | Admitting: Neurology

## 2015-02-22 ENCOUNTER — Ambulatory Visit: Payer: Medicare Other | Admitting: Neurology

## 2015-02-22 VITALS — BP 134/80 | HR 72 | Resp 16 | Ht 63.0 in | Wt 191.0 lb

## 2015-02-22 DIAGNOSIS — G250 Essential tremor: Secondary | ICD-10-CM

## 2015-02-22 MED ORDER — PROPRANOLOL HCL 10 MG PO TABS: 10.0000 mg | ORAL_TABLET | ORAL | Status: DC | PRN

## 2015-02-22 MED ORDER — METHAZOLAMIDE 50 MG PO TABS: 50.0000 mg | ORAL_TABLET | Freq: Every day | ORAL | Status: AC

## 2015-02-22 MED ORDER — PROPRANOLOL HCL ER 60 MG PO CP24: 60.0000 mg | ORAL_CAPSULE | Freq: Every day | ORAL | Status: DC

## 2015-02-22 NOTE — Patient Instructions (Addendum)
  Please remember, that any kind of tremor may be exacerbated by anxiety, anger, nervousness, excitement, dehydration, sleep deprivation, by caffeine, and low blood sugar values or blood sugar fluctuations. Some medications, especially some antidepressants and lithium can cause or exacerbate tremors. Tremors may temporarily calm down her subside with the use of a benzodiazepine such as Valium or related medications and with alcohol. Be aware however that drinking alcohol is not an approved treatment or appropriate treatment for tremor control and long-term use of benzodiazepines such as Valium, lorazepam, alprazolam, or clonazepam can cause habit formation, physical and psychological addiction.  Essential tremor can get worse with time (we call that progression). Unfortunately, there are not very many medications that help with tremors.   We will continue with Inderal LA 60 mg once daily and methazolamide 50 mg once daily. Also: take propranolol 10 mg, one pill up to 3 times a day as needed additional tremor control, that's in addition to 60 mg long acting and please monitor your cough.  Drink more water, about 6 glasses a day, consider a cane for gait safety!

## 2015-02-22 NOTE — Progress Notes (Signed)
Subjective:    Patient ID: Erica [name] is a 81 y.o. female.  HPI     Interim history:   Erica [name] is an 81 year old right-handed woman with an underlying medical history of depression and chronic cough, status post lung biopsy, status post tonsillectomy and appendectomy, who presents for followup consultation of Erica [name] essential tremor. Erica [name] is unaccompanied today. I first met Erica [name] on 02/19/2014, at which time Erica [name] presented for Erica [name] yearly checkup on Erica [name] tremors. Erica [name] felt that Erica [name] was doing quite well. Erica [name] had flareup of tremors when Erica [name] felt nervous. I suggested Erica [name] continue with Erica [name] medications. Erica [name] was advised to use low-dose propranolol 10 mg strength as needed for flareup of Erica [name] symptoms. Erica [name] felt well enough to pursue a once yearly checkup.  Today, 02/22/2015: Erica [name] reports that Erica [name] tremor is worse. Erica [name] is currently on Inderal 60 mg longacting and used the IR 10 mg as needed, but ran out. Erica [name] cough did not get worse on the additional beta-blocker. Cough is intermittent and can be daily but can be gone for 3-4 months! Stress makes it worse. Erica [name] balance is worse. Thankfully, Erica [name] has not fallen. Erica [name] has had some stressors in particular with Erica [name] older sister who has fallen and broken both shoulders and then fell again and broke Erica [name] left hip. The patient lives alone in a single family home. All 3 of Erica [name] grandchildren are close by and Erica [name] has several grandchildren who check on Erica [name] regularly. Erica [name] does not use a cane for walking and feels Erica [name] may trip over it. Erica [name] may not drink enough water, averaging about 5 glasses a day. Erica [name] drinks some ice tea but does not have to have a daily.  Previously:   Erica [name] previously followed with Dr. Morene Antu and was last seen by him on 08/22/2012 at which time he increased Erica [name] methazolamide to 50 mg daily. He kept Erica [name] on long-acting propranolol and did not suggest increasing it for fear of exacerbating Erica [name] cough and Erica [name] depression. Erica [name] was seen on 02/19/2013 by Dr. Krista Blue and  Erica [name] Inderal LA prescription was refilled with plan for a followup in one year. I reviewed Dr. Tressia Danas and Dr. Rhea Belton notes. Erica [name] has had a long-standing history of hand tremor as well as head titubation which started around 2003. There is a family history of tremors in Erica [name] PGM, father, brother and sister. Erica [name] started seeing Dr. Erling Cruz in 2008. Erica [name] has been on propranolol and methazolamide for years. Erica [name] was tried on primidone but it made Erica [name] too sleepy even on low-dose. According to the record Erica [name] has not been on Topamax or gabapentin. For Erica [name] cough Erica [name] has been evaluated at [facility] in [facility]. Erica [name] does not drink alcohol. Erica [name] does not smoke. Erica [name] sees an allergy specialist at [facility]. Erica [name] feels for the most part that Erica [name] tremor is stable or only gradually getting worse. Erica [name] does know Erica [name] triggers and realizes that when Erica [name] has company or is out with company Erica [name] tends to get worse. It is also related to how Erica [name] feels and how nervous Erica [name] is. Erica [name] has noticed that Erica [name] handwriting is gradually getting worse as well. Erica [name] feels that the medication is working rather well for Erica [name] and is not keen on trying anything new necessarily at this time Erica [name] has had a recent checkup with Erica [name] primary care physician and had some blood work.   Erica [name] Past Medical History Is Significant For: Past Medical History  Diagnosis Date  . Right knee pain   .  Tremor   . Meralgia paresthetica   . Other and unspecified hyperlipidemia   . Cough   . Depression     Erica [name] Past Surgical History Is Significant For: Past Surgical History  Procedure Laterality Date  . Tonsillectomy and adenoidectomy    . Appendectomy    . Lung biopsy  2008    Erica [name] Family History Is Significant For: Family History  Problem Relation Age of Onset  . Pancreatitis Mother     Erica [name] Social History Is Significant For: Social History   Social History  . Marital Status: Widowed    Spouse Name: N/A  . Number of Children: 3  . Years of Education: college    Occupational History  . REALTOR    Social History Main Topics  . Smoking status: Never Smoker   . Smokeless tobacco: Never Used  . Alcohol Use: No  . Drug Use: No  . Sexual Activity: Not Asked   Other Topics Concern  . 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 Patient lives alone and Erica [name] is widowed. Patient husband died from a vehicle  accident.    Right handed.    Erica [name] Allergies Are:  No Known Allergies:   Erica [name] Current Medications Are:  Outpatient Encounter Prescriptions as of 02/22/2015  Medication Sig  . budesonide-formoterol (SYMBICORT) 160-4.5 MCG/ACT inhaler Inhale 2 puffs into the lungs 2 (two) times daily.  . cholecalciferol (VITAMIN D) 1000 UNITS tablet Take by mouth daily.  Marland Kitchen co-enzyme Q-10 30 MG capsule Take 30 mg by mouth daily.  . cromolyn (OPTICROM) 4 % ophthalmic solution   . metFORMIN (GLUCOPHAGE) 500 MG tablet   . methazolamide (NEPTAZANE) 50 MG tablet Take 1 tablet (50 mg total) by mouth daily.  Marland Kitchen omeprazole (PRILOSEC) 10 MG capsule Take 10 mg by mouth daily.  . propranolol (INDERAL) 60 MG tablet Take 60 mg by mouth daily. XR 24hr caps  . propranolol ER (INDERAL LA) 60 MG 24 hr capsule Take 1 capsule (60 mg total) by mouth daily.  . sertraline (ZOLOFT) 50 MG tablet Take 50 mg by mouth daily.  . simvastatin (ZOCOR) 10 MG tablet   . [DISCONTINUED] beta carotene w/minerals (OCUVITE) tablet Take 1 tablet by mouth daily.  . [DISCONTINUED] Cinnamon 500 MG capsule Take 500 mg by mouth daily.  . [DISCONTINUED] Fluticasone Furoate-Vilanterol (BREO ELLIPTA IN) Inhale into the lungs. One inhale daily  . [DISCONTINUED] ibuprofen (ADVIL,MOTRIN) 200 MG tablet Take 200 mg by mouth 2 (two) times daily.  . [DISCONTINUED] predniSONE (DELTASONE) 20 MG tablet Take 2 pills by mouth each day for 5 days starting Sunday June 2nd,  . [DISCONTINUED] propranolol (INDERAL) 10 MG tablet Take 1 tablet (10 mg total) by mouth as  needed. Take one pill up to once daily as needed for additional tremor.   No facility-administered encounter medications on file as of 02/22/2015.  :  Review of Systems:  Out of a complete 14 point review of systems, all are reviewed and negative with the exception of these symptoms as listed below:  Review of Systems  Neurological:       Patient feels that Erica [name] tremors are getting worse    Objective:  Neurologic Exam  Physical Exam Physical Examination:   Filed Vitals:   02/22/15 1127  BP: 134/80  Pulse: 72  Resp: 16    General Examination: The patient is a very pleasant 81 y.o. female in no acute distress. Erica [name] appears  well-developed and well-nourished and very well groomed. Erica [name] is overweight.   HEENT: Normocephalic, atraumatic, pupils are equal, round and reactive to light and accommodation. Funduscopic exam is normal with sharp disc margins noted. Extraocular tracking is good without limitation to gaze excursion or nystagmus noted. Normal smooth pursuit is noted. Hearing is grossly intact. Tympanic membranes are clear bilaterally. Face is symmetric with normal facial animation and normal facial sensation. Speech is clear with no dysarthria noted. There is no hypophonia. There is mild to moderate head titubation and a mild to moderate lip tremor as well as a mild voice tremor. Neck is supple with full range of passive and active motion. There are no carotid bruits on auscultation. Oropharynx exam reveals: mild mouth dryness, adequate dental hygiene and mild airway crowding.   Chest: Clear to auscultation without wheezing, rhonchi or crackles noted.  Heart: S1+S2+0, regular and normal without murmurs, rubs or gallops noted.   Abdomen: Soft, non-tender and non-distended with normal bowel sounds appreciated on auscultation.  Extremities: There is no pitting edema in the distal lower extremities bilaterally. Pedal pulses are intact.  Skin: Warm and dry without trophic changes noted.  There are no varicose veins.  Musculoskeletal: exam reveals no obvious joint deformities, tenderness or joint swelling or erythema.   Neurologically:  Mental status: The patient is awake, alert and oriented in all 4 spheres. Erica [name] immediate and remote memory, attention, language skills and fund of knowledge are appropriate. There is no evidence of aphasia, agnosia, apraxia or anomia. Speech is clear with normal prosody and enunciation. Thought process is linear. Mood is normal and affect is normal.  Cranial nerves II - XII are as described above under HEENT exam. In addition: shoulder shrug is normal with equal shoulder height noted. Motor exam: Normal bulk, strength and tone is noted. There is no drift, or rebound. There is no resting tremor. There is a bilateral upper extremity postural and action tremor, which is mild in degree. There tremor frequency is fairly fast and the amplitude is small.  Romberg is negative. Reflexes are 2+ throughout. Fine motor skills and coordination: intact with normal finger taps, normal hand movements, normal rapid alternating patting, normal foot taps and normal foot agility.  Cerebellar testing: No dysmetria or intention tremor on finger to nose testing. There is no truncal or gait ataxia.  Sensory exam: intact to light touch in the upper and lower extremities.  Gait, station and balance: Erica [name] stands with very mild difficulty. No veering to one side is noted. No leaning to one side is noted. Posture is age-appropriate and stance is narrow based. Gait shows normal stride length and normal pace but overall Erica [name] walks slightly cautiously. No problems turning are noted. Erica [name] has trouble with tandem walk.  Assessment and Plan:   In summary, KAIZLEY AJA is a very pleasant 81 year old female with an underlying medical history of depression and chronic cough, status post lung biopsy, status post tonsillectomy and appendectomy, who presents for followup consultation of Erica [name]  long-standing history of essential tremor. Erica [name] is on a combination of Inderal LA 60 mg once daily generic and methazolamide 50 mg once daily. Erica [name] did try additional 10 mg strength propranolol immediate release as needed and felt Erica [name] tolerated it and it was helpful. Erica [name] did not notice an increase in Erica [name] cough when Erica [name] took additional propranolol. Erica [name] feels that Erica [name] tremor has become worse and Erica [name] exam does indeed show mild progression. Nevertheless, Erica [name] is tolerating the current medication regimen and  feels that it is still effective. Erica [name] has noted a gradual increase in Erica [name] tremor over the years but still feels that Erica [name] is reasonably controlled with Erica [name] current medication regimen. From time to time Erica [name] notes a flareup especially when Erica [name] is with company or under stress. There is no evidence of parkinsonism. Erica [name] has a mildly cautious gait. Erica [name] is advised to consider using a cane for gait safety. Erica [name] is encouraged to drink more water, about 6 glasses a day. In addition, I would like to increase Erica [name] as needed propranolol to 10 mg up to 3 times a day. I would like to continue propranolol long-acting 60 mg once daily as well as the methazolamide. In the past Erica [name] was tried on other medications including Mysoline but had side effects. Erica [name] is advised that essential tremor does tend to get worse with time. We mutually agreed to continue with yearly checkups. I renewed all Erica [name] prescriptions today and gave Erica [name] written instructions. I answered all Erica [name] questions today and Erica [name] was in agreement I spent 25 minutes in total face-to-face time with the patient, more than 50% of which was spent in counseling and coordination of care, reviewing test results, reviewing medication and discussing or reviewing the diagnosis of ET, its prognosis and treatment options.

## 2015-03-30 DIAGNOSIS — H04123 Dry eye syndrome of bilateral lacrimal glands: Secondary | ICD-10-CM | POA: Diagnosis not present

## 2015-03-30 DIAGNOSIS — H3531 Nonexudative age-related macular degeneration: Secondary | ICD-10-CM | POA: Diagnosis not present

## 2015-03-30 DIAGNOSIS — H25813 Combined forms of age-related cataract, bilateral: Secondary | ICD-10-CM | POA: Diagnosis not present

## 2015-03-30 DIAGNOSIS — H31091 Other chorioretinal scars, right eye: Secondary | ICD-10-CM | POA: Diagnosis not present

## 2015-05-13 DIAGNOSIS — R42 Dizziness and giddiness: Secondary | ICD-10-CM | POA: Diagnosis not present

## 2015-05-13 DIAGNOSIS — R278 Other lack of coordination: Secondary | ICD-10-CM | POA: Diagnosis not present

## 2015-05-13 DIAGNOSIS — M62838 Other muscle spasm: Secondary | ICD-10-CM | POA: Diagnosis not present

## 2015-05-13 DIAGNOSIS — M6281 Muscle weakness (generalized): Secondary | ICD-10-CM | POA: Diagnosis not present

## 2015-05-17 DIAGNOSIS — M6281 Muscle weakness (generalized): Secondary | ICD-10-CM | POA: Diagnosis not present

## 2015-05-17 DIAGNOSIS — R42 Dizziness and giddiness: Secondary | ICD-10-CM | POA: Diagnosis not present

## 2015-05-17 DIAGNOSIS — R278 Other lack of coordination: Secondary | ICD-10-CM | POA: Diagnosis not present

## 2015-05-17 DIAGNOSIS — M62838 Other muscle spasm: Secondary | ICD-10-CM | POA: Diagnosis not present

## 2015-06-09 DIAGNOSIS — R7309 Other abnormal glucose: Secondary | ICD-10-CM | POA: Diagnosis not present

## 2015-06-09 DIAGNOSIS — Z0000 Encounter for general adult medical examination without abnormal findings: Secondary | ICD-10-CM | POA: Diagnosis not present

## 2015-06-09 DIAGNOSIS — N390 Urinary tract infection, site not specified: Secondary | ICD-10-CM | POA: Diagnosis not present

## 2015-06-09 DIAGNOSIS — E7800 Pure hypercholesterolemia, unspecified: Secondary | ICD-10-CM | POA: Diagnosis not present

## 2015-06-10 DIAGNOSIS — M25512 Pain in left shoulder: Secondary | ICD-10-CM | POA: Diagnosis not present

## 2015-06-15 DIAGNOSIS — R05 Cough: Secondary | ICD-10-CM | POA: Diagnosis not present

## 2015-06-15 DIAGNOSIS — R42 Dizziness and giddiness: Secondary | ICD-10-CM | POA: Diagnosis not present

## 2015-06-15 DIAGNOSIS — E7800 Pure hypercholesterolemia, unspecified: Secondary | ICD-10-CM | POA: Diagnosis not present

## 2015-07-05 DIAGNOSIS — M6281 Muscle weakness (generalized): Secondary | ICD-10-CM | POA: Diagnosis not present

## 2015-07-05 DIAGNOSIS — R42 Dizziness and giddiness: Secondary | ICD-10-CM | POA: Diagnosis not present

## 2015-07-05 DIAGNOSIS — M62838 Other muscle spasm: Secondary | ICD-10-CM | POA: Diagnosis not present

## 2015-07-05 DIAGNOSIS — R278 Other lack of coordination: Secondary | ICD-10-CM | POA: Diagnosis not present

## 2015-07-13 DIAGNOSIS — M6281 Muscle weakness (generalized): Secondary | ICD-10-CM | POA: Diagnosis not present

## 2015-07-13 DIAGNOSIS — R278 Other lack of coordination: Secondary | ICD-10-CM | POA: Diagnosis not present

## 2015-07-13 DIAGNOSIS — M62838 Other muscle spasm: Secondary | ICD-10-CM | POA: Diagnosis not present

## 2015-07-13 DIAGNOSIS — R42 Dizziness and giddiness: Secondary | ICD-10-CM | POA: Diagnosis not present

## 2015-07-25 DIAGNOSIS — H04123 Dry eye syndrome of bilateral lacrimal glands: Secondary | ICD-10-CM | POA: Diagnosis not present

## 2015-07-25 DIAGNOSIS — H353131 Nonexudative age-related macular degeneration, bilateral, early dry stage: Secondary | ICD-10-CM | POA: Diagnosis not present

## 2015-07-25 DIAGNOSIS — H25813 Combined forms of age-related cataract, bilateral: Secondary | ICD-10-CM | POA: Diagnosis not present

## 2015-07-25 DIAGNOSIS — H31091 Other chorioretinal scars, right eye: Secondary | ICD-10-CM | POA: Diagnosis not present

## 2015-07-27 DIAGNOSIS — M62838 Other muscle spasm: Secondary | ICD-10-CM | POA: Diagnosis not present

## 2015-07-27 DIAGNOSIS — M6281 Muscle weakness (generalized): Secondary | ICD-10-CM | POA: Diagnosis not present

## 2015-07-27 DIAGNOSIS — R42 Dizziness and giddiness: Secondary | ICD-10-CM | POA: Diagnosis not present

## 2015-07-27 DIAGNOSIS — R278 Other lack of coordination: Secondary | ICD-10-CM | POA: Diagnosis not present

## 2015-08-02 DIAGNOSIS — J069 Acute upper respiratory infection, unspecified: Secondary | ICD-10-CM | POA: Diagnosis not present

## 2015-08-09 DIAGNOSIS — M62838 Other muscle spasm: Secondary | ICD-10-CM | POA: Diagnosis not present

## 2015-08-09 DIAGNOSIS — N3946 Mixed incontinence: Secondary | ICD-10-CM | POA: Diagnosis not present

## 2015-08-09 DIAGNOSIS — R42 Dizziness and giddiness: Secondary | ICD-10-CM | POA: Diagnosis not present

## 2015-08-09 DIAGNOSIS — M6281 Muscle weakness (generalized): Secondary | ICD-10-CM | POA: Diagnosis not present

## 2015-08-09 DIAGNOSIS — R278 Other lack of coordination: Secondary | ICD-10-CM | POA: Diagnosis not present

## 2015-08-22 DIAGNOSIS — H25811 Combined forms of age-related cataract, right eye: Secondary | ICD-10-CM | POA: Diagnosis not present

## 2015-08-22 DIAGNOSIS — H2511 Age-related nuclear cataract, right eye: Secondary | ICD-10-CM | POA: Diagnosis not present

## 2015-10-07 DIAGNOSIS — H2512 Age-related nuclear cataract, left eye: Secondary | ICD-10-CM | POA: Diagnosis not present

## 2015-10-12 DIAGNOSIS — H353131 Nonexudative age-related macular degeneration, bilateral, early dry stage: Secondary | ICD-10-CM | POA: Diagnosis not present

## 2015-10-13 DIAGNOSIS — L821 Other seborrheic keratosis: Secondary | ICD-10-CM | POA: Diagnosis not present

## 2015-10-13 DIAGNOSIS — D225 Melanocytic nevi of trunk: Secondary | ICD-10-CM | POA: Diagnosis not present

## 2015-10-13 DIAGNOSIS — D224 Melanocytic nevi of scalp and neck: Secondary | ICD-10-CM | POA: Diagnosis not present

## 2015-10-13 DIAGNOSIS — D1801 Hemangioma of skin and subcutaneous tissue: Secondary | ICD-10-CM | POA: Diagnosis not present

## 2015-10-13 DIAGNOSIS — L814 Other melanin hyperpigmentation: Secondary | ICD-10-CM | POA: Diagnosis not present

## 2015-10-13 DIAGNOSIS — Z85828 Personal history of other malignant neoplasm of skin: Secondary | ICD-10-CM | POA: Diagnosis not present

## 2015-10-17 DIAGNOSIS — H2512 Age-related nuclear cataract, left eye: Secondary | ICD-10-CM | POA: Diagnosis not present

## 2015-11-02 DIAGNOSIS — R05 Cough: Secondary | ICD-10-CM | POA: Diagnosis not present

## 2015-11-02 DIAGNOSIS — R2681 Unsteadiness on feet: Secondary | ICD-10-CM | POA: Diagnosis not present

## 2015-11-02 DIAGNOSIS — R739 Hyperglycemia, unspecified: Secondary | ICD-10-CM | POA: Diagnosis not present

## 2015-11-02 DIAGNOSIS — E7800 Pure hypercholesterolemia, unspecified: Secondary | ICD-10-CM | POA: Diagnosis not present

## 2015-11-02 DIAGNOSIS — R0981 Nasal congestion: Secondary | ICD-10-CM | POA: Diagnosis not present

## 2015-11-10 DIAGNOSIS — R05 Cough: Secondary | ICD-10-CM | POA: Diagnosis not present

## 2015-11-10 DIAGNOSIS — R739 Hyperglycemia, unspecified: Secondary | ICD-10-CM | POA: Diagnosis not present

## 2015-11-10 DIAGNOSIS — E7800 Pure hypercholesterolemia, unspecified: Secondary | ICD-10-CM | POA: Diagnosis not present

## 2015-12-02 ENCOUNTER — Encounter (INDEPENDENT_AMBULATORY_CARE_PROVIDER_SITE_OTHER): Payer: Medicare Other | Admitting: Ophthalmology

## 2015-12-02 DIAGNOSIS — H43813 Vitreous degeneration, bilateral: Secondary | ICD-10-CM | POA: Diagnosis not present

## 2015-12-02 DIAGNOSIS — H53411 Scotoma involving central area, right eye: Secondary | ICD-10-CM

## 2015-12-02 DIAGNOSIS — H35033 Hypertensive retinopathy, bilateral: Secondary | ICD-10-CM | POA: Diagnosis not present

## 2015-12-02 DIAGNOSIS — I10 Essential (primary) hypertension: Secondary | ICD-10-CM | POA: Diagnosis not present

## 2015-12-02 DIAGNOSIS — H353122 Nonexudative age-related macular degeneration, left eye, intermediate dry stage: Secondary | ICD-10-CM

## 2016-02-14 DIAGNOSIS — Z79899 Other long term (current) drug therapy: Secondary | ICD-10-CM | POA: Diagnosis not present

## 2016-02-14 DIAGNOSIS — R05 Cough: Secondary | ICD-10-CM | POA: Diagnosis not present

## 2016-02-14 DIAGNOSIS — E7800 Pure hypercholesterolemia, unspecified: Secondary | ICD-10-CM | POA: Diagnosis not present

## 2016-02-17 DIAGNOSIS — E7800 Pure hypercholesterolemia, unspecified: Secondary | ICD-10-CM | POA: Diagnosis not present

## 2016-02-17 DIAGNOSIS — R739 Hyperglycemia, unspecified: Secondary | ICD-10-CM | POA: Diagnosis not present

## 2016-02-22 ENCOUNTER — Encounter: Payer: Self-pay | Admitting: Neurology

## 2016-02-22 ENCOUNTER — Ambulatory Visit (INDEPENDENT_AMBULATORY_CARE_PROVIDER_SITE_OTHER): Payer: Medicare Other | Admitting: Neurology

## 2016-02-22 VITALS — BP 120/76 | HR 78 | Resp 16 | Ht 63.0 in | Wt 194.0 lb

## 2016-02-22 DIAGNOSIS — G4733 Obstructive sleep apnea (adult) (pediatric): Secondary | ICD-10-CM | POA: Diagnosis not present

## 2016-02-22 DIAGNOSIS — G250 Essential tremor: Secondary | ICD-10-CM

## 2016-02-22 DIAGNOSIS — E669 Obesity, unspecified: Secondary | ICD-10-CM | POA: Diagnosis not present

## 2016-02-22 DIAGNOSIS — G4710 Hypersomnia, unspecified: Secondary | ICD-10-CM | POA: Diagnosis not present

## 2016-02-22 DIAGNOSIS — R351 Nocturia: Secondary | ICD-10-CM

## 2016-02-22 MED ORDER — PRIMIDONE 50 MG PO TABS: ORAL_TABLET | ORAL | 5 refills | Status: DC

## 2016-02-22 MED ORDER — PROPRANOLOL HCL 10 MG PO TABS: ORAL_TABLET | ORAL | 3 refills | Status: DC

## 2016-02-22 MED ORDER — PROPRANOLOL HCL ER 60 MG PO CP24: 60.0000 mg | ORAL_CAPSULE | Freq: Every day | ORAL | 3 refills | Status: DC

## 2016-02-22 NOTE — Progress Notes (Signed)
Subjective:    Patient ID: Erica [name] is a 82 y.o. female.  HPI     Interim history:   Erica [name] is an 82 year old right-handed woman with an underlying medical history of depression and chronic cough, status post lung biopsy, status post tonsillectomy and appendectomy, who presents for followup consultation of her essential tremor. She is unaccompanied today. I last saw her on 02/22/2016, at which time she reported that her tremor was worse. She was on Inderal long-acting 60 mg once daily and immediate release 10 mg as needed. She had an intermittent cough. She had no recent falls. She had additional stressors in her family including a sister who fell. The patient continued to live alone in a single family home. Her children were living close by grandchildren were checking on her as well. She was not using a cane. She does not always drinking enough water. Exam was stable, I suggested we continue with her medication regimen. I suggested a one-year checkup.  Today, 02/22/2016: She reports that the methazolamide is too expensive. She continues to take the long-acting and short-acting propranolol. She was diagnosed with OSA over 10 years, and tried CPAP for at least a year, but ultimately could not tolerate it. She is willing to try CPAP again, as she feels sleepy during the day. She travels a lot. Loves to travel, sees her sister in [place] next week and will be in [place] for 3 weeks and October and November.  Previously:  I first met her on 02/19/2014, at which time she presented for her yearly checkup on her tremors. She felt that she was doing quite well. She had flareup of tremors when she felt nervous. I suggested she continue with her medications. She was advised to use low-dose propranolol 10 mg strength as needed for flareup of her symptoms. She felt well enough to pursue a once yearly checkup.    She previously followed with Dr. Morene Antu and was last seen by him on 08/22/2012 at which  time he increased her methazolamide to 50 mg daily. He kept her on long-acting propranolol and did not suggest increasing it for fear of exacerbating her cough and her depression. She was seen on 02/19/2013 by Dr. Krista Blue and her Inderal LA prescription was refilled with plan for a followup in one year. I reviewed Dr. Tressia Danas and Dr. Rhea Belton notes. She has had a long-standing history of hand tremor as well as head titubation which started around 2003. There is a family history of tremors in her PGM, father, brother and sister. She started seeing Dr. Erling Cruz in 2008. She has been on propranolol and methazolamide for years. She was tried on primidone but it made her too sleepy even on low-dose. According to the record she has not been on Topamax or gabapentin. For her cough she has been evaluated at [facility] and [facility]. She does not drink alcohol. She does not smoke. She sees an allergy specialist at [facility]. She feels for the most part that her tremor is stable or only gradually getting worse. She does know her triggers and realizes that when she has company or is out with company she tends to get worse. It is also related to how she feels and how nervous she is. She has noticed that her handwriting is gradually getting worse as well. She feels that the medication is working rather well for her and is not keen on trying anything new necessarily at this time she has had a recent  checkup with her primary care physician and had some blood work.   Her Past Medical History Is Significant For: Past Medical History:  Diagnosis Date  . Cough   . Depression   . Meralgia paresthetica   . Other and unspecified hyperlipidemia   . Right knee pain   . Tremor     Her Past Surgical History Is Significant For: Past Surgical History:  Procedure Laterality Date  . APPENDECTOMY    . LUNG BIOPSY  2008  . TONSILLECTOMY AND ADENOIDECTOMY      Her Family History Is Significant For: Family History  Problem Relation Age of  Onset  . Pancreatitis Mother     Her Social History Is Significant For: Social History   Social History  . Marital status: Widowed    Spouse name: N/A  . Number of children: 3  . Years of education: college   Occupational History  . REALTOR [employer]   Social History Main Topics  . Smoking status: Never Smoker  . Smokeless tobacco: Never Used  . Alcohol use No  . Drug use: No  . Sexual activity: Not Asked   Other Topics Concern  . 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 Patient lives alone and she is widowed. Patient husband died from a vehicle  accident.    Right handed.   Her Allergies Are:  No Known Allergies:   Her Current Medications Are:  Outpatient Encounter Prescriptions as of 02/22/2016  Medication Sig  . budesonide-formoterol (SYMBICORT) 160-4.5 MCG/ACT inhaler Inhale 2 puffs into the lungs 2 (two) times daily.  . cholecalciferol (VITAMIN D) 1000 UNITS tablet Take by mouth daily.  Marland Kitchen co-enzyme Q-10 30 MG capsule Take 30 mg by mouth daily.  . metFORMIN (GLUCOPHAGE) 500 MG tablet   . methazolamide (NEPTAZANE) 50 MG tablet Take 1 tablet (50 mg total) by mouth daily.  . Multiple Vitamins-Minerals (PRESERVISION AREDS 2 PO) Take by mouth.  Marland Kitchen omeprazole (PRILOSEC) 10 MG capsule Take 10 mg by mouth daily.  . propranolol (INDERAL) 10 MG tablet Take 1 tablet (10 mg total) by mouth as needed. Take 1 pill up to 3 times a day as needed for tremor control (in addition to 60 mg LA).  Marland Kitchen propranolol ER (INDERAL LA) 60 MG 24 hr capsule Take 1 capsule (60 mg total) by mouth daily.  . sertraline (ZOLOFT) 50 MG tablet Take 50 mg by mouth daily.  . simvastatin (ZOCOR) 10 MG tablet   . [DISCONTINUED] cromolyn (OPTICROM) 4 % ophthalmic solution    No facility-administered encounter medications on file as of 02/22/2016.   :  Review of Systems:  Out of a complete 14 point review of systems, all are reviewed and  negative with the exception of these symptoms as listed below:  Review of Systems  Neurological:       She would like to discuss an alternative to Methazolamide, states that it is too expensive.     Objective:  Neurologic Exam  Physical Exam Physical Examination:   Vitals:   02/22/16 0824  BP: 120/76  Pulse: 78  Resp: 16   General Examination: The patient is a very pleasant 82 y.o. female in no acute distress. She appears well-developed and well-nourished and very well groomed. She is overweight. She is in good spirits today.  HEENT: Normocephalic, atraumatic, pupils are equal, round and reactive to light and accommodation.  Extraocular tracking is good without limitation  to gaze excursion or nystagmus noted. Normal smooth pursuit is noted. Hearing is grossly intact. Face is symmetric with normal facial animation and normal facial sensation. No dysarthria noted. There is no hypophonia. There is mild to moderate head titubation and a mild to moderate lip tremor as well as a mild voice tremor. Neck is supple with full range of passive and active motion. There are no carotid bruits on auscultation. Oropharynx exam reveals: mild mouth dryness, adequate dental hygiene and mild airway crowding.   Chest: Clear to auscultation without wheezing, rhonchi or crackles noted.  Heart: S1+S2+0, regular and normal without murmurs, rubs or gallops noted.   Abdomen: Soft, non-tender and non-distended with normal bowel sounds appreciated on auscultation.  Extremities: There is no pitting edema in the distal lower extremities bilaterally. Pedal pulses are intact.  Skin: Warm and dry without trophic changes noted. There are no varicose veins.  Musculoskeletal: exam reveals no obvious joint deformities, tenderness or joint swelling or erythema.   Neurologically:  Mental status: The patient is awake, alert and oriented in all 4 spheres. Her immediate and remote memory, attention, language skills and  fund of knowledge are appropriate. There is no evidence of aphasia, agnosia, apraxia or anomia. Speech is clear with normal prosody and enunciation. Thought process is linear. Mood is normal and affect is normal.  Cranial nerves II - XII are as described above under HEENT exam. In addition: shoulder shrug is normal with equal shoulder height noted. Motor exam: Normal bulk, strength and tone is noted. There is no drift, or rebound. There is no resting tremor. There is a bilateral upper extremity postural and action tremor, which is mild in degree. There tremor frequency is fairly fast and the amplitude is small.  Romberg is negative. Reflexes are 2+ throughout. Fine motor skills and coordination: Very mildly impaired globally, there is no truncal or gait ataxia, sensory exam is intact to light touch in the upper and lower extremities bilaterally.   Gait, station and balance: She stands with very mild difficulty. No veering to one side is noted. No leaning to one side is noted. Posture is age-appropriate and stance is narrow based. Gait shows normal stride length and normal pace but overall she walks slightly cautiously. No problems turning are noted. She has trouble with tandem walk, stable.  Assessment and Plan:   In summary, SHENEE WIGNALL is a very pleasant 82 year old female with an underlying medical history of depression and chronic cough, status post lung biopsy, status post tonsillectomy and appendectomy, who presents for followup consultation of her long-standing history of essential tremor, more pronounced in her midline than in her hands. She is on a combination of Inderal LA 60 mg once daily generic and short acting Inderal generic 10 mg strength up to 3 a day. She has been on methazolamide 50 mg once daily. But it is too expensive, costing her more than $300 per month which is outrageous! She has tried in the past Mysoline and would be willing to try it again, we will start very slowly with 25  mg at night for about 4 weeks and then she can start taking 50 mg at night after that, we will do a 6 week checkup with one of our nurse practitioners to see if she is tolerating the Mysoline and whether we can cautiously increase it even. She is advised to start this after she has tapered off of methazolamide, this she can reduce by half for a week then stop.  In addition, she is planning a trip next week to see her sister in [place], she will start the Mysoline after she is back. In addition, we talked about her daytime somnolence, she has a prior diagnosis of sleep apnea over 10 years ago and tried CPAP but could not tolerate it at the time, finding the mask too cumbersome, and the machine to loud, nevertheless, she still has sleepiness in the daytime and does not always wake up fully rested, reports nocturia as well and difficulty losing weight even though she is physically very active and tries to watch what she eats. At this juncture, she is willing to be retested for sleep apnea and try CPAP therapy for the need arises. I commend her for that, we will do a sleep study at her next convenience and I will see her back after that.  I answered all her questions today and she was in agreement I spent 25 minutes in total face-to-face time with the patient, more than 50% of which was spent in counseling and coordination of care, reviewing test results, reviewing medication and discussing or reviewing the diagnosis of ET and OSA, the prognosis and treatment options.

## 2016-02-22 NOTE — Patient Instructions (Addendum)
You can taper off the methazolamide by taking half a pill daily for a week then stop.  We can try you on low-dose primidone with very gradual titration. You tried this in the past and reported that it made you too sleepy, we can try again, but be aware that it can make you sleepy. Wait till you are back from your trip to start new med.  Mysoline (primidone) 50 mg strength: Take 1/2 pill each bedtime for 4 weeks, then 1 pill each bedtime thereafter. Common side effects reported are: Sleepiness, drowsiness, balance problems, confusion, and GI related symptoms.  You can see on of our nurse practitioners in about 6 weeks for tremor check up; I will see you after the sleep study.   Unfortunately, there are not a whole lot of options for symptomatic treatment of essential tremor.  Based on your symptoms and your exam I believe you are still at risk for obstructive sleep apnea or OSA, and I think we should proceed with a sleep study to determine whether you do or do not have OSA and how severe it is. If you have more than mild OSA, I want you to consider treatment with CPAP. Please remember, the risks and ramifications of moderate to severe obstructive sleep apnea or OSA are: Cardiovascular disease, including congestive heart failure, stroke, difficult to control hypertension, arrhythmias, and even type 2 diabetes has been linked to untreated OSA. Sleep apnea causes disruption of sleep and sleep deprivation in most cases, which, in turn, can cause recurrent headaches, problems with memory, mood, concentration, focus, and vigilance. Most people with untreated sleep apnea report excessive daytime sleepiness, which can affect their ability to drive. Please do not drive if you feel sleepy.   I will likely see you back after your sleep study to go over the test results and where to go from there. We will call you after your sleep study to advise about the results (most likely, you will hear from Beverlee Nims, my nurse) and  to set up an appointment at the time, as necessary.    Our sleep lab administrative assistant, Arrie Aran will meet with you or call you to schedule your sleep study. If you don't hear back from her by next week please feel free to call her at [phone]. This is her direct line and please leave a message with your phone number to call back if you get the voicemail box. She will call back as soon as possible.

## 2016-02-29 ENCOUNTER — Other Ambulatory Visit: Payer: Self-pay

## 2016-03-12 ENCOUNTER — Ambulatory Visit (INDEPENDENT_AMBULATORY_CARE_PROVIDER_SITE_OTHER): Payer: Medicare Other | Admitting: Neurology

## 2016-03-12 DIAGNOSIS — G4733 Obstructive sleep apnea (adult) (pediatric): Secondary | ICD-10-CM

## 2016-03-12 DIAGNOSIS — G4761 Periodic limb movement disorder: Secondary | ICD-10-CM

## 2016-03-12 DIAGNOSIS — G4720 Circadian rhythm sleep disorder, unspecified type: Secondary | ICD-10-CM

## 2016-03-13 ENCOUNTER — Encounter: Payer: Self-pay | Admitting: *Deleted

## 2016-03-19 ENCOUNTER — Telehealth: Payer: Self-pay | Admitting: Neurology

## 2016-03-19 DIAGNOSIS — G4761 Periodic limb movement disorder: Secondary | ICD-10-CM

## 2016-03-19 DIAGNOSIS — G4720 Circadian rhythm sleep disorder, unspecified type: Secondary | ICD-10-CM

## 2016-03-19 DIAGNOSIS — G4733 Obstructive sleep apnea (adult) (pediatric): Secondary | ICD-10-CM

## 2016-03-19 NOTE — Telephone Encounter (Signed)
LM for patient to call back for results

## 2016-03-19 NOTE — Telephone Encounter (Signed)
Patient last seen on 02/22/16. Her sleep study confirmed her Dx of OSA, moderate to severe. CPAP is recommended and I will order a CPAP study. She had severe PLMs too, which may be in part due to her sertraline, and/or RLS, but may improve with CPAP therapy.

## 2016-03-23 ENCOUNTER — Telehealth: Payer: Self-pay | Admitting: Neurology

## 2016-03-23 NOTE — Telephone Encounter (Signed)
Patient called wanting her sleep results.  Please give her a call with the results.

## 2016-03-27 ENCOUNTER — Telehealth: Payer: Self-pay

## 2016-03-27 NOTE — Telephone Encounter (Signed)
Thank you. I have sent copy of report to PCP.

## 2016-03-27 NOTE — Telephone Encounter (Signed)
Patient wanted results of her sleep study before she scheduled her cpap titration. I gave her the results and explained the cpap study to her. She is now scheduled.

## 2016-03-28 ENCOUNTER — Ambulatory Visit (INDEPENDENT_AMBULATORY_CARE_PROVIDER_SITE_OTHER): Payer: Medicare Other | Admitting: Neurology

## 2016-03-28 DIAGNOSIS — G4733 Obstructive sleep apnea (adult) (pediatric): Secondary | ICD-10-CM

## 2016-04-09 ENCOUNTER — Telehealth: Payer: Self-pay | Admitting: Neurology

## 2016-04-09 DIAGNOSIS — G4733 Obstructive sleep apnea (adult) (pediatric): Secondary | ICD-10-CM

## 2016-04-09 DIAGNOSIS — Z23 Encounter for immunization: Secondary | ICD-10-CM | POA: Diagnosis not present

## 2016-04-09 NOTE — Progress Notes (Signed)
PATIENT'S NAME:  Erica [name], Erica [name] DOB:      [birth year]      MR#:    [id number]     DATE OF RECORDING: 03/28/2016 REFERRING M.D.:  Jani Gravel, MD Study Performed:   CPAP  Titration HISTORY: 81 year old right-handed woman with an underlying medical history of depression and chronic cough, status post lung biopsy, status post tonsillectomy and appendectomy, who returns for full night CPAP titration study. Her baseline study from 03/12/16 confirmed her Dx of OSA, moderate to severe total AHI (21.7) and a O2 nadir of 86%.   The patient's weight 194 pounds with a height of 76 (inches), resulting in a BMI of 23.6 kg/m2.  The patient's neck circumference measured 15 inches.  CURRENT MEDICATIONS: Budesonide-formoterol, Metformin, Methazolamide, Multi-Vitamin, Omeprazole, Primidone, Propranolol, Sertraline and Simvastatin    PROCEDURE:  This is a multichannel digital polysomnogram utilizing the SomnoStar 11.2 system.  Electrodes and sensors were applied and monitored per AASM Specifications.   EEG, EOG, Chin and Limb EMG, were sampled at 200 Hz.  ECG, Snore and Nasal Pressure, Thermal Airflow, Respiratory Effort, CPAP Flow and Pressure, Oximetry was sampled at 50 Hz. Digital video and audio were recorded.       CPAP was initiated at 5 cmH20 with heated humidity per AASM split night standards and pressure was advanced to 9 cmH20 because of hypopneas, apneas and desaturations.  At a PAP pressure of 9 cmH20, there was a reduction of the AHI to 2.0 per hour.    Lights Out was at 22:32 and Lights On at 05:04. Total recording time (TRT) was 392 minutes, with a total sleep time (TST) of 292 minutes. The patient's sleep latency was 43.5 minutes with 86 minutes of wake time after sleep onset. REM latency was 97.5 minutes.  The sleep efficiency was 74.5 %.    SLEEP ARCHITECTURE: Wake after Sleep onset was 86 minutes with moderate sleep fragmentation noted, Stage N1 4.8%, Stage N2 79.8%, which is increased, Stage N3 0%  and Stage R (REM sleep) 15.4%.   RESPIRATORY ANALYSIS:  There was a total of 5 respiratory events: 0 obstructive apneas, 2 central apneas and 0 mixed apneas with a total of 2 apneas and an apnea index (AI) of .4. There were 3 hypopneas with a hypopnea index of .6. The patient also had 3 respiratory event related arousals (RERAs).      The total APNEA/HYPOPNEA INDEX  (AHI) was 1. and the total RESPIRATORY DISTURBANCE INDEX was 1.6.  3 events occurred in REM sleep and 2 events in NREM. The REM AHI was 4, versus a non-REM AHI of .5. The patient spent 28% of total sleep time in the supine position. The supine AHI was 0.0, versus a non-supine AHI of 1.5.  OXYGEN SATURATION & C02:  The baseline 02 saturation was in the high 90s, with the lowest being 96%. Time spent below 89% saturation equaled 0 minutes. Of note, in the beginning of the study, the oxymetry was erroneous.    PERIODIC LIMB MOVEMENTS: (Baseline)   The patient had a total of 0 Periodic Limb Movements. The Periodic Limb Movement (PLM) index was 0 and the PLM Arousal index was 0  The Technoligist noted the patient was fitted with a Resmed AirFit P10 (small) apparatus.  DIAGNOSIS 1.  Obstructive Sleep Apnea   PLANS/RECOMMENDATIONS: 1.  This was a successful CPAP titration with resolution of the patient's OSA on optimal CPAP. I will, therefore, start the patient on home CPAP therapy at a  pressure of 9 cm via nasal pillows. The patient should be advised to be fully compliant with PAP therapy.  2.  The patient should be reminded to keep good sleep hygiene. She should be advised not to drive or operate heavy machinery when tired or sleepy.    3. A follow up appointment will be scheduled in the Sleep Clinic at [facility]. Her PCP will be notified of the test results.    I certify that I have reviewed the entire raw data recording prior to the issuance of this report in accordance with the Standards of Accreditation of the  American Academy of Sleep Medicine (AASM)       Star Age, MD, PhD Diplomat, American Board of Psychiatry and Neurology  Diplomat, [place] of Sleep Medicine

## 2016-04-09 NOTE — Telephone Encounter (Signed)
Please call and inform patient that I have entered an order for treatment with positive airway pressure (PAP) treatment of obstructive sleep apnea (OSA). She did well during the latest sleep study with CPAP. We will, therefore, arrange for a machine for home use through a DME (durable medical equipment) company of Her choice; and I will see the patient back in follow-up in about 8-10 weeks. Please also explain to the patient that I will be looking out for compliance data, which can be downloaded from the machine (stored on an SD card, that is inserted in the machine) or via remote access through a modem, that is built into the machine. At the time of the followup appointment we will discuss sleep study results and how it is going with PAP treatment at home. Please advise patient to bring Her machine at the time of the first FU visit, even though this is cumbersome. Bringing the machine for every visit after that will likely not be needed, but often helps for the first visit to troubleshoot if needed. Please re-enforce the importance of compliance with treatment and the need for [place] to monitor compliance data - often an insurance requirement and actually good feedback for the patient as far as how they are doing.  Also remind patient, that any interim PAP machine or mask issues should be first addressed with the DME company, as they can often help better with technical and mask fit issues. Please ask if patient has a preference regarding DME company.  Please also make sure, the patient has a follow-up appointment with me in about 8-10 weeks from the setup date, thanks.  Once you have spoken to the patient - and faxed/routed report to PCP and referring MD (if other than PCP), you can close this encounter, thanks,   Star Age, MD, PhD [facility] ([place])

## 2016-04-10 ENCOUNTER — Ambulatory Visit (INDEPENDENT_AMBULATORY_CARE_PROVIDER_SITE_OTHER): Payer: Medicare Other | Admitting: Adult Health

## 2016-04-10 ENCOUNTER — Encounter: Payer: Self-pay | Admitting: Adult Health

## 2016-04-10 VITALS — BP 122/74 | HR 72 | Ht 63.0 in | Wt 195.0 lb

## 2016-04-10 DIAGNOSIS — H8112 Benign paroxysmal vertigo, left ear: Secondary | ICD-10-CM | POA: Diagnosis not present

## 2016-04-10 DIAGNOSIS — R260 Ataxic gait: Secondary | ICD-10-CM | POA: Diagnosis not present

## 2016-04-10 DIAGNOSIS — G4733 Obstructive sleep apnea (adult) (pediatric): Secondary | ICD-10-CM

## 2016-04-10 DIAGNOSIS — G250 Essential tremor: Secondary | ICD-10-CM

## 2016-04-10 DIAGNOSIS — R42 Dizziness and giddiness: Secondary | ICD-10-CM | POA: Diagnosis not present

## 2016-04-10 NOTE — Progress Notes (Addendum)
[name]: Erica [name] DOB: 02-23-1932  REASON FOR VISIT: follow up HISTORY FROM: [name]  HISTORY OF PRESENT ILLNESS: Today 04/10/2016: Erica [name] is an 82 year old female with a history of essential tremor and obstructive sleep apnea. She returns today for follow-up. At Erica last visit she was started on Mysoline 25 mg at bedtime for 1 month and increasing to 50 mg. She states that she is still taking 25 mg she does find that beneficial. Erica tremor primarily affects Erica [name] hands and head. She states that she is able to complete all ADLs independently. She does have trouble eating soup due to Erica tremor. She is able to operate a motor vehicle without difficulty. She did have a sleep study that confirmed that she had moderate to severe sleep apnea. She has not yet started CPAP therapy. She returns today for an evaluation.  HISTORY per Dr. Guadelupe Sabin note: Erica [name] is an 82 year old right-handed woman with an underlying medical history of depression and chronic cough, status post lung biopsy, status post tonsillectomy and appendectomy, who presents for followup consultation of Erica [name] essential tremor. She is unaccompanied today. I last saw Erica [name] on 02/22/2016, at which time she reported that Erica [name] tremor was worse. She was on Inderal long-acting 60 mg once daily and immediate release 10 mg as needed. She had an intermittent cough. She had no recent falls. She had additional stressors in Erica [name] family including a sister who fell. Erica [name] continued to live alone in a single family home. Erica [name] children were living close by grandchildren were checking on Erica [name] as well. She was not using a cane. She does not always drinking enough water. Exam was stable, I suggested we continue with Erica [name] medication regimen. I suggested a one-year checkup.  Today, 02/22/2016: She reports that Erica methazolamide is too expensive. She continues to take Erica long-acting and short-acting propranolol. She was diagnosed with OSA over 10 years,  and tried CPAP for at least a year, but ultimately could not tolerate it. She is willing to try CPAP again, as she feels sleepy during Erica day. She travels a lot. Loves to travel, sees Erica [name] sister in [place] next week and will be in [place] for 3 weeks and October and November.  Previously:  I first met Erica [name] on 02/19/2014, at which time she presented for Erica [name] yearly checkup on Erica [name] tremors. She felt that she was doing quite well. She had flareup of tremors when she felt nervous. I suggested she continue with Erica [name] medications. She was advised to use low-dose propranolol 10 mg strength as needed for flareup of Erica [name] symptoms. She felt well enough to pursue a once yearly checkup.  She previously followed with Dr. Morene Antu and was last seen by him on 08/22/2012 at which time he increased Erica [name] methazolamide to 50 mg daily. He kept Erica [name] on long-acting propranolol and did not suggest increasing it for fear of exacerbating Erica [name] cough and Erica [name] depression. She was seen on 02/19/2013 by Dr. Krista Blue and Erica [name] Inderal LA prescription was refilled with plan for a followup in one year. I reviewed Dr. Tressia Danas and Dr. Rhea Belton notes. She has had a long-standing history of hand tremor as well as head titubation which started around 2003. There is a family history of tremors in Erica [name] PGM, father, brother and sister. She started seeing Dr. Erling Cruz in 2008. She has been on propranolol and methazolamide for years. She was tried on primidone but it made Erica [name] too sleepy even on low-dose. According to Erica record  she has not been on Topamax or gabapentin. For Erica [name] cough she has been evaluated at [facility] and [facility]. She does not drink alcohol. She does not smoke. She sees an allergy specialist at [facility]. She feels for Erica most part that Erica [name] tremor is stable or only gradually getting worse. She does know Erica [name] triggers and realizes that when she has company or is out with company she tends to get worse. It is also related to how she feels and how nervous she  is. She has noticed that Erica [name] handwriting is gradually getting worse as well. She feels that Erica medication is working rather well for Erica [name] and is not keen on trying anything new necessarily at this time she has had a recent checkup with Erica [name] primary care physician and had some blood work.  REVIEW OF SYSTEMS: Out of a complete 14 system review of symptoms, Erica [name] complains only of Erica following symptoms, and all other reviewed systems are negative.  Daytime sleepiness  ALLERGIES: No Known Allergies  HOME MEDICATIONS: Outpatient Medications Prior to Visit  Medication Sig Dispense Refill  . budesonide-formoterol (SYMBICORT) 160-4.5 MCG/ACT inhaler Inhale 2 puffs into Erica lungs 2 (two) times daily.    . cholecalciferol (VITAMIN D) 1000 UNITS tablet Take by mouth daily.    Marland Kitchen co-enzyme Q-10 30 MG capsule Take 30 mg by mouth daily.    . metFORMIN (GLUCOPHAGE) 500 MG tablet     . methazolamide (NEPTAZANE) 50 MG tablet Take 1 tablet (50 mg total) by mouth daily. 90 tablet 3  . Multiple Vitamins-Minerals (PRESERVISION AREDS 2 PO) Take by mouth.    Marland Kitchen omeprazole (PRILOSEC) 10 MG capsule Take 10 mg by mouth daily.    . primidone (MYSOLINE) 50 MG tablet 1/2 pill each bedtime x 4 weeks, then 1 pill nightly thereafter. 30 tablet 5  . propranolol (INDERAL) 10 MG tablet Take 1 pill up to 3 times a day as needed for tremor control (in addition to 60 mg LA). 270 tablet 3  . propranolol ER (INDERAL LA) 60 MG 24 hr capsule Take 1 capsule (60 mg total) by mouth daily. 90 capsule 3  . sertraline (ZOLOFT) 50 MG tablet Take 50 mg by mouth daily.    . simvastatin (ZOCOR) 10 MG tablet      No facility-administered medications prior to visit.     PAST MEDICAL HISTORY: Past Medical History:  Diagnosis Date  . Cough   . Depression   . Meralgia paresthetica   . Other and unspecified hyperlipidemia   . Right knee pain   . Tremor     PAST SURGICAL HISTORY: Past Surgical History:  Procedure Laterality Date    . APPENDECTOMY    . LUNG BIOPSY  2008  . TONSILLECTOMY AND ADENOIDECTOMY      FAMILY HISTORY: Family History  Problem Relation Age of Onset  . Pancreatitis Mother     SOCIAL HISTORY: Social History   Social History  . Marital status: Widowed    Spouse name: N/A  . Number of children: 3  . Years of education: college   Occupational History  . REALTOR [employer]   Social History Main Topics  . Smoking status: Never Smoker  . Smokeless tobacco: Never Used  . Alcohol use No  . Drug use: No  . Sexual activity: Not on file   Other Topics Concern  . 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 [name]  worked for [employer]. [name] lives alone and she is widowed. [name] husband died from a vehicle  accident.    Right handed.      PHYSICAL EXAM  Vitals:   04/10/16 0829  BP: 122/74  Pulse: 72  Weight: 195 lb (88.5 kg)  Height: '5\' 3"'  (1.6 m)   Body mass index is 34.54 kg/m.  Generalized: Well developed, in no acute distress   Neurological examination  Mentation: Alert oriented to time, place, history taking. Follows all commands speech and language fluent Cranial nerve II-XII: Pupils were equal round reactive to light. Extraocular movements were full, visual field were full on confrontational test. Facial sensation and strength were normal. Uvula tongue midline. Head turning and shoulder shrug  were normal and symmetric. Motor: Erica motor testing reveals 5 over 5 strength of all 4 extremities. Good symmetric motor tone is noted throughout. Head tremor. Mild action tremor in hands bilaterally Sensory: Sensory testing is intact to soft touch on all 4 extremities. No evidence of extinction is noted.  Coordination: Cerebellar testing reveals good finger-nose-finger and heel-to-shin bilaterally.  Gait and station: Gait is normal. Tandem gait not attempted. Romberg is negative. No drift is seen.  Reflexes: Deep tendon reflexes  are symmetric and normal bilaterally.   DIAGNOSTIC DATA (LABS, IMAGING, TESTING) - I reviewed [name] records, labs, notes, testing and imaging myself where available.    ASSESSMENT AND PLAN 82 y.o. year old female  has a past medical history of Cough; Depression; Meralgia paresthetica; Other and unspecified hyperlipidemia; Right knee pain; and Tremor. here with:  1. Essential tremor 2. Obstructive sleep apnea  Erica [name] will increase Mysoline to 50 mg at bedtime. If she is unable to tolerate that she will let us know. Erica [name] is going out of town for 1 month. She is amenable to starting CPAP when she returns. Beverlee Nims, RN is aware of this and will call Erica [name] when she returns to get Erica [name] set up on CPAP. [name] advised that if Erica [name] symptoms worsen or she develops any new symptoms she should let us know. She will follow-up in 4-5 months with Dr. Carmelia Bake, MSN, NP-C 04/10/2016, 8:48 AM [facility] [address], [place], [place] [address] [phone]  I reviewed Erica above note and documentation by Erica Nurse Practitioner and agree with Erica history, physical exam, assessment and plan as outlined above. I was immediately available for face-to-face consultation. Star Age, MD, PhD [facility])

## 2016-04-10 NOTE — Patient Instructions (Signed)
Increase Mysoline to 1 tablet at bedtime Will setup cpap when you return in November If your symptoms worsen or you develop new symptoms please let us know.

## 2016-04-10 NOTE — Telephone Encounter (Signed)
Patient was here in office today, to see [facility] NP. She is aware of results and is willing to start treatment. I will send orders to DME. I will send report to PCP. Patient will get a letter reminding her to make f/u appt and stress the importance of compliance.

## 2016-05-16 DIAGNOSIS — E7800 Pure hypercholesterolemia, unspecified: Secondary | ICD-10-CM | POA: Diagnosis not present

## 2016-05-16 DIAGNOSIS — R739 Hyperglycemia, unspecified: Secondary | ICD-10-CM | POA: Diagnosis not present

## 2016-08-05 ENCOUNTER — Encounter: Payer: Self-pay | Admitting: Neurology

## 2016-08-07 ENCOUNTER — Encounter: Payer: Self-pay | Admitting: Neurology

## 2016-08-07 ENCOUNTER — Ambulatory Visit (INDEPENDENT_AMBULATORY_CARE_PROVIDER_SITE_OTHER): Payer: Medicare Other | Admitting: Neurology

## 2016-08-07 VITALS — BP 142/82 | HR 60 | Resp 20 | Ht 63.0 in | Wt 196.0 lb

## 2016-08-07 DIAGNOSIS — G250 Essential tremor: Secondary | ICD-10-CM

## 2016-08-07 DIAGNOSIS — Z9989 Dependence on other enabling machines and devices: Secondary | ICD-10-CM

## 2016-08-07 DIAGNOSIS — G4733 Obstructive sleep apnea (adult) (pediatric): Secondary | ICD-10-CM

## 2016-08-07 MED ORDER — PRIMIDONE 50 MG PO TABS
50.0000 mg | ORAL_TABLET | Freq: Every day | ORAL | 3 refills | Status: DC
Start: 2016-08-07 — End: 2017-11-04

## 2016-08-07 NOTE — Patient Instructions (Addendum)
Please continue using your CPAP regularly. While your insurance requires that you use CPAP at least 4 hours each night on 70% of the nights, I recommend, that you not skip any nights and use it throughout the night if you can. Getting used to CPAP and staying with the treatment long term does take time and patience and discipline. Untreated obstructive sleep apnea when it is moderate to severe can have an adverse impact on cardiovascular health and raise her risk for heart disease, arrhythmias, hypertension, congestive heart failure, stroke and diabetes. Untreated obstructive sleep apnea causes sleep disruption, nonrestorative sleep, and sleep deprivation. This can have an impact on your day to day functioning and cause daytime sleepiness and impairment of cognitive function, memory loss, mood disturbance, and problems focussing. Using CPAP regularly can improve these symptoms. Keep up the good work! Continue working on your exercise regimen and weight loss endeavors.   Tremor is stable. Lets continue with your Propranolol LA and the 10 mg as needed and primidone 50 mg at night.   We will do a 6 month follow up for tremor and sleep apnea.

## 2016-08-07 NOTE — Progress Notes (Signed)
Subjective:    Patient ID: Erica [name] is a 83 y.o. female.  HPI     Interim history:   Ms. Erica [name] is an 83 year old right-handed woman with an underlying medical history of depression and chronic cough, status post lung biopsy, status post tonsillectomy and appendectomy, who presents for followup consultation of her essential tremor and her sleep disorder, in particular his sleep apnea, after her sleep studies. The patient is unaccompanied today.. I last saw her on 02/22/2016, at which time she was on propranolol for her tremor but methazolamide was too expensive. I suggested we tapered her off the methazolamide and retry her on low-dose primidone. She also reported a diagnosis of obstructive sleep apnea several years ago and complained of sleepiness during the day, was willing to come back for sleep study for reevaluation. She had a baseline sleep study, followed by a CPAP titration study. I went over her test results with her in detail today. Her baseline sleep study from 03/12/2016 showed a sleep efficiency of 83.3%, arousal index was 20 per hour, increased percentage of stage I sleep, increased percentage of stage II sleep, absence of slow-wave sleep and absence of REM sleep. Total AHI was 21.7 per hour, rising to 56.7 per hour in the supine position. Average oxygen saturation was 90%, nadir was 86%. She had severe PLMS with very mild arousals. She was invited for a full night CPAP titration study. She had this on 03/28/2016. Sleep efficiency was 74.5%, sleep latency 43.5 minutes, she had absence of slow-wave sleep and REM sleep was 15.4%, increase of stage II sleep. CPAP was titrated from 5 cm to 9 cm. AHI was 2 per hour on the final pressure, O2 nadir 87%, based on her test results are prescribed CPAP therapy for home use.   Today, 08/07/2016 (all dictated new, as well as above notes, some dictation done in note pad or Word, outside of chart, may appear as copied):  I reviewed her CPAP  compliance data from 07/07/2016 through 08/05/2016, which is a total of 30 days, during which time she used her machine every night with percent used days greater than 4 hours at 100%, indicating superb compliance with an average usage of 7 hours and 44 minutes, residual AHI borderline at 6.2 per hour, leak low with the 95th percentile at 7.8 L/m on a pressure of 9 cm with EPR of 3. She reports doing well with CPAP, sleep is better quality, tremor stable and takes the propranolol LA 60 mg around 9 AM along with a 10 mg IR, typically no more IR for the rest of the day. She saw Ward Givens in the interim on 04/10/2016 and was advised to try to increase Mysoline to 50 mg at bedtime. She reported some benefit with the Mysoline at 25 mg at night. Feels, like the primidone has helped, takes 1 pill at bedtime around 10 PM. Likes her CPAP, better daytime energy. Has been working on an exercise regimen, first with a trainer, works on her balance, does yoga, has been using honey and cinnamon with water 3 times a day.  The patient's allergies, current medications, family history, past medical history, past social history, past surgical history and problem list were reviewed and updated as appropriate.   Previously (copied from previous notes for reference):    I first met her on 02/19/2014, at which time she presented for her yearly checkup on her tremors. She felt that she was doing quite well. She had flareup of tremors  when she felt nervous. I suggested she continue with her medications. She was advised to use low-dose propranolol 10 mg strength as needed for flareup of her symptoms. She felt well enough to pursue a once yearly checkup.   She previously followed with Dr. Morene Antu and was last seen by him on 08/22/2012 at which time he increased her methazolamide to 50 mg daily. He kept her on long-acting propranolol and did not suggest increasing it for fear of exacerbating her cough and her depression. She was  seen on 02/19/2013 by Dr. Krista Blue and her Inderal LA prescription was refilled with plan for a followup in one year. I reviewed Dr. Tressia Danas and Dr. Rhea Belton notes. She has had a long-standing history of hand tremor as well as head titubation which started around 2003. There is a family history of tremors in her PGM, father, brother and sister. She started seeing Dr. Erling Cruz in 2008. She has been on propranolol and methazolamide for years. She was tried on primidone but it made her too sleepy even on low-dose. According to the record she has not been on Topamax or gabapentin. For her cough she has been evaluated at [facility] and [facility]. She does not drink alcohol. She does not smoke. She sees an allergy specialist at [facility]. She feels for the most part that her tremor is stable or only gradually getting worse. She does know her triggers and realizes that when she has company or is out with company she tends to get worse. It is also related to how she feels and how nervous she is. She has noticed that her handwriting is gradually getting worse as well. She feels that the medication is working rather well for her and is not keen on trying anything new necessarily at this time she has had a recent checkup with her primary care physician and had some blood work.  Her Past Medical History Is Significant For: Past Medical History:  Diagnosis Date  . Cough   . Depression   . Meralgia paresthetica   . Other and unspecified hyperlipidemia   . Right knee pain   . Tremor     Her Past Surgical History Is Significant For: Past Surgical History:  Procedure Laterality Date  . APPENDECTOMY    . LUNG BIOPSY  2008  . TONSILLECTOMY AND ADENOIDECTOMY      Her Family History Is Significant For: Family History  Problem Relation Age of Onset  . Pancreatitis Mother     Her Social History Is Significant For: Social History   Social History  . Marital status: Widowed    Spouse name: N/A  . Number of children: 3  .  Years of education: college   Occupational History  . [employer]TOR [employer]   Social History Main Topics  . Smoking status: Never Smoker  . Smokeless tobacco: Never Used  . Alcohol use No  . Drug use: No  . Sexual activity: Not Asked   Other Topics Concern  . 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 Patient lives alone and she is widowed. Patient husband died from a vehicle  accident.    Right handed.    Her Allergies Are:  No Known Allergies:   Her Current Medications Are:  Outpatient Encounter Prescriptions as of 08/07/2016  Medication Sig  . budesonide-formoterol (SYMBICORT) 160-4.5 MCG/ACT inhaler Inhale 2 puffs into the lungs 2 (two) times daily.  . cholecalciferol (  VITAMIN D) 1000 UNITS tablet Take by mouth daily.  Marland Kitchen co-enzyme Q-10 30 MG capsule Take 30 mg by mouth daily.  . metFORMIN (GLUCOPHAGE) 500 MG tablet 250 mg.   . methazolamide (NEPTAZANE) 50 MG tablet Take 1 tablet (50 mg total) by mouth daily.  . Multiple Vitamins-Minerals (PRESERVISION AREDS 2 PO) Take by mouth.  Marland Kitchen omeprazole (PRILOSEC) 10 MG capsule Take 10 mg by mouth daily.  . primidone (MYSOLINE) 50 MG tablet 1/2 pill each bedtime x 4 weeks, then 1 pill nightly thereafter.  . propranolol (INDERAL) 10 MG tablet Take 1 pill up to 3 times a day as needed for tremor control (in addition to 60 mg LA).  Marland Kitchen propranolol ER (INDERAL LA) 60 MG 24 hr capsule Take 1 capsule (60 mg total) by mouth daily.  . sertraline (ZOLOFT) 50 MG tablet Take 50 mg by mouth daily.  . simvastatin (ZOCOR) 10 MG tablet 10 mg.    No facility-administered encounter medications on file as of 08/07/2016.   :  Review of Systems:  Out of a complete 14 point review of systems, all are reviewed and negative with the exception of these symptoms as listed below: Review of Systems  Neurological:       Pt presents today to discuss her cpap usage. Pt says that she feels much better  when she uses her cpap. Pt reports that her tremor is about the same.    Objective:  Neurologic Exam  Physical Exam Physical Examination:   Vitals:   08/07/16 1406  BP: (!) 142/82  Pulse: 60  Resp: 20   General Examination: The patient is a very pleasant 83 y.o. female in no acute distress. She appears well-developed and well-nourished and well groomed.   HEENT: Normocephalic, atraumatic, pupils are equal, round and reactive to light and accommodation. Extraocular tracking is good without limitation to gaze excursion or nystagmus noted. Normal smooth pursuit is noted. Hearing is grossly intact. Face is symmetric with normal facial animation and normal facial sensation. Speech is clear with no dysarthria noted. There is no hypophonia, but she has mild voice tremor. She has mild to moderate head tremor and a lower lip tremor which is also in the moderate range. She has mild mouth dryness on oropharynx exam, she has mild airway crowding, adequate dental hygiene, tongue and palate are symmetrical.   Chest: Clear to auscultation without wheezing, rhonchi or crackles noted.  Heart: S1+S2+0, regular and normal without murmurs, rubs or gallops noted.   Abdomen: Soft, non-tender and non-distended with normal bowel sounds appreciated on auscultation.  Extremities: There is no pitting edema in the distal lower extremities bilaterally. Pedal pulses are intact.  Skin: Warm and dry without trophic changes noted.  Musculoskeletal: exam reveals no obvious joint deformities, tenderness or joint swelling or erythema.   Neurologically:  Mental status: The patient is awake, alert and oriented in all 4 spheres. Her immediate and remote memory, attention, language skills and fund of knowledge are appropriate. There is no evidence of aphasia, agnosia, apraxia or anomia. Speech is clear with normal prosody and enunciation. Thought process is linear. Mood is normal and affect is normal.  Cranial nerves II -  XII are as described above under HEENT exam. In addition: shoulder shrug is normal with equal shoulder height noted. Motor exam: Normal bulk, strength and tone is noted. There is no drift, tremor or rebound. Reflexes are 1-2+ throughout. Fine motor skills and coordination: intact with normal finger taps, normal hand movements, normal rapid  alternating patting, normal foot taps and normal foot agility.  Cerebellar testing: No dysmetria or intention tremor on finger to nose testing. Heel to shin is unremarkable bilaterally. There is no truncal or gait ataxia.  Sensory exam: intact to light touch in the upper and lower extremities.  Gait, station and balance: She stands with mild difficulty, she has a slightly insecure walk, turn slowly, tandem walk is not possible safely. Romberg is negative except for mild sway.  Assessment and Plan:    In summary, JARISSA SHERIFF is a very pleasant 83 y.o.-year old female with an Underlying medical history of depression, cough, overweight state and prediabetes who presents for follow-up consultation of her essential tremor as well as her obstructive sleep apnea after her recent sleep studies in September 2017. She had moderate obstructive sleep apnea confirmed via sleep study, did well with CPAP therapy at 9 cm. We talked about her test results as well as her compliance data. She is fully compliant with treatment and feels improved with respect to her sleep quality, sleep consolidation and daytime energy. Tremor is stable, she has been able to tolerate Mysoline 50 mg at night and she takes long-acting Inderal 60 mg once daily and 10 mg once daily, she knows that she can go up to 1 pill 3 times a day for the immediate release propranolol. She did not need refills on the beta blocker but I refilled her Mysoline prescription for 90 days. She is commended for her excellent treatment adherence with CPAP and is encouraged to continue with her current medications and follow CPAP  usage. I answered all her questions today and she was in agreement. I will see her back in 6 months, sooner as needed. I spent 25 minutes in total face-to-face time with the patient, more than 50% of which was spent in counseling and coordination of care, reviewing test results, reviewing medication and discussing or reviewing the diagnosis of ET and OSA, the prognosis and treatment options. Pertinent laboratory and imaging test results that were available during this visit with the patient were reviewed by me and considered in my medical decision making (see chart for details).

## 2016-08-14 ENCOUNTER — Ambulatory Visit: Payer: Medicare Other | Admitting: Neurology

## 2016-09-12 ENCOUNTER — Other Ambulatory Visit: Payer: Self-pay | Admitting: Internal Medicine

## 2016-09-12 DIAGNOSIS — Z1231 Encounter for screening mammogram for malignant neoplasm of breast: Secondary | ICD-10-CM

## 2016-09-13 ENCOUNTER — Ambulatory Visit
Admission: RE | Admit: 2016-09-13 | Discharge: 2016-09-13 | Disposition: A | Discharge status: Home / Self Care | Payer: Medicare Other | Source: Ambulatory Visit | Attending: Internal Medicine | Admitting: Internal Medicine

## 2016-09-13 DIAGNOSIS — Z1231 Encounter for screening mammogram for malignant neoplasm of breast: Secondary | ICD-10-CM | POA: Diagnosis not present

## 2016-10-03 DIAGNOSIS — E7800 Pure hypercholesterolemia, unspecified: Secondary | ICD-10-CM | POA: Diagnosis not present

## 2016-10-03 DIAGNOSIS — M859 Disorder of bone density and structure, unspecified: Secondary | ICD-10-CM | POA: Diagnosis not present

## 2016-10-03 DIAGNOSIS — N390 Urinary tract infection, site not specified: Secondary | ICD-10-CM | POA: Diagnosis not present

## 2016-10-03 DIAGNOSIS — R7303 Prediabetes: Secondary | ICD-10-CM | POA: Diagnosis not present

## 2016-10-03 DIAGNOSIS — Z0000 Encounter for general adult medical examination without abnormal findings: Secondary | ICD-10-CM | POA: Diagnosis not present

## 2016-10-03 DIAGNOSIS — M8580 Other specified disorders of bone density and structure, unspecified site: Secondary | ICD-10-CM | POA: Diagnosis not present

## 2016-10-10 DIAGNOSIS — R0600 Dyspnea, unspecified: Secondary | ICD-10-CM | POA: Diagnosis not present

## 2016-10-10 DIAGNOSIS — E7800 Pure hypercholesterolemia, unspecified: Secondary | ICD-10-CM | POA: Diagnosis not present

## 2016-10-10 DIAGNOSIS — R739 Hyperglycemia, unspecified: Secondary | ICD-10-CM | POA: Diagnosis not present

## 2016-10-10 DIAGNOSIS — R0609 Other forms of dyspnea: Secondary | ICD-10-CM | POA: Diagnosis not present

## 2016-10-10 DIAGNOSIS — Z0000 Encounter for general adult medical examination without abnormal findings: Secondary | ICD-10-CM | POA: Diagnosis not present

## 2016-11-19 DIAGNOSIS — E119 Type 2 diabetes mellitus without complications: Secondary | ICD-10-CM | POA: Diagnosis not present

## 2016-11-19 DIAGNOSIS — E7800 Pure hypercholesterolemia, unspecified: Secondary | ICD-10-CM | POA: Diagnosis not present

## 2016-11-19 DIAGNOSIS — R0609 Other forms of dyspnea: Secondary | ICD-10-CM | POA: Diagnosis not present

## 2016-11-20 DIAGNOSIS — H31091 Other chorioretinal scars, right eye: Secondary | ICD-10-CM | POA: Diagnosis not present

## 2016-11-20 DIAGNOSIS — H04123 Dry eye syndrome of bilateral lacrimal glands: Secondary | ICD-10-CM | POA: Diagnosis not present

## 2016-11-20 DIAGNOSIS — H353131 Nonexudative age-related macular degeneration, bilateral, early dry stage: Secondary | ICD-10-CM | POA: Diagnosis not present

## 2016-11-20 DIAGNOSIS — Z961 Presence of intraocular lens: Secondary | ICD-10-CM | POA: Diagnosis not present

## 2016-11-28 DIAGNOSIS — R0609 Other forms of dyspnea: Secondary | ICD-10-CM | POA: Diagnosis not present

## 2016-11-30 DIAGNOSIS — R0609 Other forms of dyspnea: Secondary | ICD-10-CM | POA: Diagnosis not present

## 2016-12-21 DIAGNOSIS — R0609 Other forms of dyspnea: Secondary | ICD-10-CM | POA: Diagnosis not present

## 2016-12-21 DIAGNOSIS — E119 Type 2 diabetes mellitus without complications: Secondary | ICD-10-CM | POA: Diagnosis not present

## 2016-12-21 DIAGNOSIS — E7800 Pure hypercholesterolemia, unspecified: Secondary | ICD-10-CM | POA: Diagnosis not present

## 2017-01-11 DIAGNOSIS — L738 Other specified follicular disorders: Secondary | ICD-10-CM | POA: Diagnosis not present

## 2017-01-11 DIAGNOSIS — D225 Melanocytic nevi of trunk: Secondary | ICD-10-CM | POA: Diagnosis not present

## 2017-01-11 DIAGNOSIS — L821 Other seborrheic keratosis: Secondary | ICD-10-CM | POA: Diagnosis not present

## 2017-01-11 DIAGNOSIS — D1801 Hemangioma of skin and subcutaneous tissue: Secondary | ICD-10-CM | POA: Diagnosis not present

## 2017-01-11 DIAGNOSIS — L814 Other melanin hyperpigmentation: Secondary | ICD-10-CM | POA: Diagnosis not present

## 2017-02-04 ENCOUNTER — Encounter: Payer: Self-pay | Admitting: Neurology

## 2017-02-04 ENCOUNTER — Encounter (INDEPENDENT_AMBULATORY_CARE_PROVIDER_SITE_OTHER): Payer: Self-pay

## 2017-02-04 ENCOUNTER — Ambulatory Visit (INDEPENDENT_AMBULATORY_CARE_PROVIDER_SITE_OTHER): Payer: Medicare Other | Admitting: Neurology

## 2017-02-04 VITALS — BP 148/84 | HR 62 | Ht 62.0 in | Wt 194.0 lb

## 2017-02-04 DIAGNOSIS — Z9989 Dependence on other enabling machines and devices: Secondary | ICD-10-CM | POA: Diagnosis not present

## 2017-02-04 DIAGNOSIS — G4733 Obstructive sleep apnea (adult) (pediatric): Secondary | ICD-10-CM

## 2017-02-04 DIAGNOSIS — G250 Essential tremor: Secondary | ICD-10-CM | POA: Diagnosis not present

## 2017-02-04 NOTE — Progress Notes (Signed)
Subjective:    Patient ID: Erica [name] is a 83 y.o. female.  HPI     Interim history:   Erica [name] is an 83 year old right-handed woman with an underlying medical history of depression and chronic cough, status post lung biopsy, status post tonsillectomy and appendectomy, who presents for followup consultation of her essential tremor and OSA, on CPAP. The patient is unaccompanied today. I last saw her on 08/07/2016, which time she was fully compliant with CPAP and reported doing well. She felt that her sleep quality was better. Tremor was stable. She was on long-acting propranolol 60 mg in the morning as well as 10 mg propranolol immediate release. She reports doing well with CPAP, sleep is better quality, tremor stable and takes the propranolol LA 60 mg around 9 AM along with a 10 mg IR, typically no more IR for the rest of the day. She had seen Ward Givens in the interim on 04/10/2016 and was advised to try to increase Mysoline to 50 mg at bedtime. She was working on a regular exercise program.    Today, 02/04/2017: I reviewed her CPAP compliance data from 11/02/2016 through 01/30/2017, which is a total of 90 days, during which time she used her machine 67 days with percent used days greater than 4 hours at 71%. In the past 30 days she had a compliance percentage of 73%, average usage of 8 hours and 3 minutes. She had skipped 8 nights out of the past 30 between 01/01/2017 and 01/30/2017. Average AHI borderline at 5.2, leak acceptable with the 95th percentile at 15.1 liters per minute on a pressure of 9 cm with EPR of 3. She reports doing well with CPAP therapy. She feels like she sleeps better with it. She does admit that she did not take it when she went to [place] in June. She is encouraged to try to take it on her trips as she does have moderate obstructive sleep apnea and it helps her sleep apnea quite well when she uses it. She has had updated supplies. Tremor has become worse. She does  not recall using the Mysoline at this time. It looks like she picked it up in March and did not pick it up in April, when we called her pharmacy for clarification. They do have refills left for her and she is encouraged to restart it if possible. She does report taking the long-acting propranolol once daily and the immediate release tablet once or twice daily.   The patient's allergies, current medications, family history, past medical history, past social history, past surgical history and problem list were reviewed and updated as appropriate.    Previously (copied from previous notes for reference):   I saw her on 02/22/2016, at which time she was on propranolol for her tremor but methazolamide was too expensive. I suggested we tapered her off the methazolamide and retry her on low-dose primidone. She also reported a diagnosis of obstructive sleep apnea several years ago and complained of sleepiness during the day, was willing to come back for sleep study for reevaluation. She had a baseline sleep study, followed by a CPAP titration study. I went over her test results with her in detail today. Her baseline sleep study from 03/12/2016 showed a sleep efficiency of 83.3%, arousal index was 20 per hour, increased percentage of stage I sleep, increased percentage of stage II sleep, absence of slow-wave sleep and absence of REM sleep. Total AHI was 21.7 per hour, rising to 56.7 per hour  in the supine position. Average oxygen saturation was 90%, nadir was 86%. She had severe PLMS with very mild arousals. She was invited for a full night CPAP titration study. She had this on 03/28/2016. Sleep efficiency was 74.5%, sleep latency 43.5 minutes, she had absence of slow-wave sleep and REM sleep was 15.4%, increase of stage II sleep. CPAP was titrated from 5 cm to 9 cm. AHI was 2 per hour on the final pressure, O2 nadir 87%, based on her test results are prescribed CPAP therapy for home use.    I reviewed her CPAP  compliance data from 07/07/2016 through 08/05/2016, which is a total of 30 days, during which time she used her machine every night with percent used days greater than 4 hours at 100%, indicating superb compliance with an average usage of 7 hours and 44 minutes, residual AHI borderline at 6.2 per hour, leak low with the 95th percentile at 7.8 L/m on a pressure of 9 cm with EPR of 3.    I first met her on 02/19/2014, at which time she presented for her yearly checkup on her tremors. She felt that she was doing quite well. She had flareup of tremors when she felt nervous. I suggested she continue with her medications. She was advised to use low-dose propranolol 10 mg strength as needed for flareup of her symptoms. She felt well enough to pursue a once yearly checkup.   She previously followed with Dr. Morene Antu and was last seen by him on 08/22/2012 at which time he increased her methazolamide to 50 mg daily. He kept her on long-acting propranolol and did not suggest increasing it for fear of exacerbating her cough and her depression. She was seen on 02/19/2013 by Dr. Krista Blue and her Inderal LA prescription was refilled with plan for a followup in one year. I reviewed Dr. Tressia Danas and Dr. Rhea Belton notes. She has had a long-standing history of hand tremor as well as head titubation which started around 2003. There is a family history of tremors in her PGM, father, brother and sister. She started seeing Dr. Erling Cruz in 2008. She has been on propranolol and methazolamide for years. She was tried on primidone but it made her too sleepy even on low-dose. According to the record she has not been on Topamax or gabapentin. For her cough she has been evaluated at [facility] and [facility]. She does not drink alcohol. She does not smoke. She sees an allergy specialist at [facility]. She feels for the most part that her tremor is stable or only gradually getting worse. She does know her triggers and realizes that when she has company or is  out with company she tends to get worse. It is also related to how she feels and how nervous she is. She has noticed that her handwriting is gradually getting worse as well. She feels that the medication is working rather well for her and is not keen on trying anything new necessarily at this time she has had a recent checkup with her primary care physician and had some blood work.  Her Past Medical History Is Significant For: Past Medical History:  Diagnosis Date  . Cough   . Depression   . Meralgia paresthetica   . Other and unspecified hyperlipidemia   . Right knee pain   . Tremor     Her Past Surgical History Is Significant For: Past Surgical History:  Procedure Laterality Date  . APPENDECTOMY    . LUNG BIOPSY  2008  . TONSILLECTOMY AND ADENOIDECTOMY      Her Family History Is Significant For: Family History  Problem Relation Age of Onset  . Pancreatitis Mother     Her Social History Is Significant For: Social History   Social History  . Marital status: Widowed    Spouse name: N/A  . Number of children: 3  . Years of education: college   Occupational History  . [employer]TOR [employer]   Social History Main Topics  . Smoking status: Never Smoker  . Smokeless tobacco: Never Used  . Alcohol use No  . Drug use: No  . Sexual activity: Not Asked   Other Topics Concern  . None   Social History Narrative   Patient is retired Veterinary surgeon for 33 years. Patient worked for [employer]. Patient lives alone and she is widowed. Patient husband died from a vehicle  accident.    Right handed.    Her Allergies Are:  No Known Allergies:   Her Current Medications Are:  Outpatient Encounter Prescriptions as of 02/04/2017  Medication Sig  . budesonide-formoterol (SYMBICORT) 160-4.5 MCG/ACT inhaler Inhale 2 puffs into the lungs 2 (two) times daily as needed.   . cholecalciferol (VITAMIN D) 1000 UNITS tablet Take by mouth daily.  Marland Kitchen co-enzyme Q-10 30 MG capsule Take 30 mg by mouth  daily.  . metFORMIN (GLUCOPHAGE) 500 MG tablet 250 mg.   . methazolamide (NEPTAZANE) 50 MG tablet Take 1 tablet (50 mg total) by mouth daily.  . Multiple Vitamins-Minerals (PRESERVISION AREDS 2 PO) Take by mouth.  Marland Kitchen omeprazole (PRILOSEC) 10 MG capsule Take 10 mg by mouth daily.  . propranolol (INDERAL) 10 MG tablet Take 1 pill up to 3 times a day as needed for tremor control (in addition to 60 mg LA).  Marland Kitchen propranolol ER (INDERAL LA) 60 MG 24 hr capsule Take 1 capsule (60 mg total) by mouth daily.  . sertraline (ZOLOFT) 50 MG tablet Take 50 mg by mouth daily.  . simvastatin (ZOCOR) 10 MG tablet 10 mg.   . primidone (MYSOLINE) 50 MG tablet Take 1 tablet (50 mg total) by mouth at bedtime.   No facility-administered encounter medications on file as of 02/04/2017.   :  Review of Systems:  Out of a complete 14 point review of systems, all are reviewed and negative with the exception of these symptoms as listed below:  Review of Systems  Neurological:       Pt presents today to discuss her cpap and tremor. Pt says that her cpap is going well and she is getting supplies from [employer]. Pt is unsure if she is taking primidone qhs. She does not think she is taking anything at bedtime, other than her cholesterol medication.    Objective:  Neurological Exam  Physical Exam Physical Examination:   Vitals:   02/04/17 0935  BP: (!) 148/84  Pulse: 62    General Examination: The patient is a very pleasant 83 y.o. female in no acute distress. She appears well-developed and well-nourished and very well groomed.   HEENT: Normocephalic, atraumatic, pupils are equal, round and reactive to light and accommodation. Extraocular tracking is good without limitation to gaze excursion or nystagmus noted. Normal smooth pursuit is noted. Hearing is mildly impaired. Face is symmetric with normal facial animation and normal facial sensation. Speech is clear with no dysarthria noted. There is no hypophonia, but she has  a mild voice tremor. She has mild to moderate head tremor and a lower lip tremor  which is also in the moderate range, seems stable. She has mild mouth dryness on oropharynx exam, she has mild airway crowding, adequate dental hygiene, tongue and palate are central/symmetrical.   Chest: Clear to auscultation without wheezing, rhonchi or crackles noted.  Heart: S1+S2+0, regular and normal without murmurs, rubs or gallops noted.   Abdomen: Soft, non-tender and non-distended with normal bowel sounds appreciated on auscultation.  Extremities: There is no pitting edema in the distal lower extremities bilaterally. Pedal pulses are intact.  Skin: Warm and dry without trophic changes noted.  Musculoskeletal: exam reveals no obvious joint deformities, tenderness or joint swelling or erythema.   Neurologically:  Mental status: The patient is awake, alert and oriented in all 4 spheres. Her immediate and remote memory, attention, language skills and fund of knowledge are appropriate. There is no evidence of aphasia, agnosia, apraxia or anomia. Speech is clear with normal prosody and enunciation. Thought process is linear. Mood is normal and affect is normal.  Cranial nerves II - XII are as described above under HEENT exam. In addition: shoulder shrug is normal with equal shoulder height noted. Motor exam: Normal bulk, strength and tone is noted. There is no drift, resting tremor or rebound. Reflexes are 1-2+ throughout. Fine motor skills and coordination: intact for age. Minimal postural tremor.  Cerebellar testing: No dysmetria or intention tremor on finger to nose testing. There is no truncal or gait ataxia.  Sensory exam: intact to light touch in the upper and lower extremities.  Gait, station and balance: She stands with mild difficulty, she has a slightly insecure walk, turn slowly, tandem walk is not possible safely.    Assessment and Plan:    In summary, GLENDI MOHIUDDIN is a very pleasant  83 year old female with an underlying medical history of depression, cough, overweight state and prediabetes who presents for follow-up consultation of her essential tremor as well as her obstructive sleep apnea well established on CPAP therapy. She had sleep study testing in late 2017. Her sleep study confirmed moderate obstructive sleep apnea. She has done well with CPAP at 9 cm of water pressure but has had some as she travels. She is encouraged to take her machine with her when she plans to travel for more than just a night or 2 here and there. She is commended for her treatment adherence. Tremor is for the most part stable but she may not be on Mysoline any longer. She has been on Inderal long-acting 60 mg once daily as well as 10 mg once or twice daily as an as needed. She is encouraged to restart Mysoline as she did not report any side effects to [place]. She is encouraged to start Mysoline 51 g strength half a pill each night for about 2 weeks and then increase it to 1 pill nightly thereafter. She has refills left at her pharmacy and we called her pharmacy to confirm. She is advised to follow-up in about 6 months with one of our nurse practitioners. She is encouraged to call in the interim if she has any questions or concerns or problems with medication tolerance. I answered all her questions today and she was in agreement. I spent 25 minutes in total face-to-face time with the patient, more than 50% of which was spent in counseling and coordination of care, reviewing test results, reviewing medication and discussing or reviewing the diagnosis of ET and OSA, the prognosis and treatment options. Pertinent laboratory and imaging test results that were available during this  visit with the patient were reviewed by me and considered in my medical decision making (see chart for details).

## 2017-02-04 NOTE — Patient Instructions (Addendum)
Thank you for choosing [facility] for your neurological care! It was good to see you again today! I appreciate that you entrust me with your healthcare concerns.  Here is what we discussed today and what we came up with as our plan for you: You can restart the Mysoline (primidone) 50 mg strength: Take 1/2 pill each bedtime for 2 weeks, then 1 pill each bedtime thereafter.  Common side effects reported are: Sleepiness, drowsiness, balance problems, confusion, and GI related symptoms.  It looks like you filled it in March, but not after that.    Please continue using your CPAP regularly. While your insurance requires that you use CPAP at least 4 hours each night on 70% of the nights, I recommend, that you not skip any nights and use it throughout the night if you can. Getting used to CPAP and staying with the treatment long term does take time and patience and discipline. Untreated obstructive sleep apnea when it is moderate to severe can have an adverse impact on cardiovascular health and raise her risk for heart disease, arrhythmias, hypertension, congestive heart failure, stroke and diabetes. Untreated obstructive sleep apnea causes sleep disruption, nonrestorative sleep, and sleep deprivation. This can have an impact on your day to day functioning and cause daytime sleepiness and impairment of cognitive function, memory loss, mood disturbance, and problems focussing. Using CPAP regularly can improve these symptoms.

## 2017-02-25 ENCOUNTER — Telehealth: Payer: Self-pay | Admitting: Neurology

## 2017-02-25 NOTE — Telephone Encounter (Signed)
Unfortunately, at this time, I would not recommend any additional medication for her tremors, and I agree with her staying off of primidone. Please notify patient.

## 2017-02-25 NOTE — Telephone Encounter (Signed)
I called pt. She reports that she tried the primidone 2 days last week but felt "lousy" after taking it. She was unsteady and her eyes could not focus. She did not feel that the primidone helped her tremors and is wondering if Dr. Rexene Alberts recommends anything else.

## 2017-02-25 NOTE — Telephone Encounter (Signed)
Patient is calling stating sprimidone (MYSOLINE) 50 MG tablet sent her into 2 days of not being able to function so she stopped taking it. Is there anything else she should do?

## 2017-02-25 NOTE — Telephone Encounter (Signed)
I called pt. I advised her that Dr. Rexene Alberts does not recommend any additional medication for her tremors and recommends that pt stay off of primidone. Pt verbalized understanding. Pt had no questions at this time but was encouraged to call back if questions arise.

## 2017-02-26 DIAGNOSIS — N390 Urinary tract infection, site not specified: Secondary | ICD-10-CM | POA: Diagnosis not present

## 2017-02-26 DIAGNOSIS — R197 Diarrhea, unspecified: Secondary | ICD-10-CM | POA: Diagnosis not present

## 2017-02-26 DIAGNOSIS — R1032 Left lower quadrant pain: Secondary | ICD-10-CM | POA: Diagnosis not present

## 2017-03-06 DIAGNOSIS — E7800 Pure hypercholesterolemia, unspecified: Secondary | ICD-10-CM | POA: Diagnosis not present

## 2017-03-06 DIAGNOSIS — R739 Hyperglycemia, unspecified: Secondary | ICD-10-CM | POA: Diagnosis not present

## 2017-03-12 DIAGNOSIS — E7800 Pure hypercholesterolemia, unspecified: Secondary | ICD-10-CM | POA: Diagnosis not present

## 2017-03-12 DIAGNOSIS — Z0000 Encounter for general adult medical examination without abnormal findings: Secondary | ICD-10-CM | POA: Diagnosis not present

## 2017-03-12 DIAGNOSIS — R739 Hyperglycemia, unspecified: Secondary | ICD-10-CM | POA: Diagnosis not present

## 2017-03-12 DIAGNOSIS — R197 Diarrhea, unspecified: Secondary | ICD-10-CM | POA: Diagnosis not present

## 2017-03-26 DIAGNOSIS — Z23 Encounter for immunization: Secondary | ICD-10-CM | POA: Diagnosis not present

## 2017-03-26 DIAGNOSIS — J329 Chronic sinusitis, unspecified: Secondary | ICD-10-CM | POA: Diagnosis not present

## 2017-03-26 DIAGNOSIS — H6501 Acute serous otitis media, right ear: Secondary | ICD-10-CM | POA: Diagnosis not present

## 2017-04-18 ENCOUNTER — Other Ambulatory Visit: Payer: Self-pay | Admitting: Neurology

## 2017-04-18 DIAGNOSIS — G250 Essential tremor: Secondary | ICD-10-CM

## 2017-06-13 DIAGNOSIS — J069 Acute upper respiratory infection, unspecified: Secondary | ICD-10-CM | POA: Diagnosis not present

## 2017-06-13 DIAGNOSIS — J329 Chronic sinusitis, unspecified: Secondary | ICD-10-CM | POA: Diagnosis not present

## 2017-07-29 ENCOUNTER — Other Ambulatory Visit: Payer: Self-pay | Admitting: Neurology

## 2017-07-29 DIAGNOSIS — G250 Essential tremor: Secondary | ICD-10-CM

## 2017-08-07 ENCOUNTER — Encounter (INDEPENDENT_AMBULATORY_CARE_PROVIDER_SITE_OTHER): Payer: Self-pay

## 2017-08-07 ENCOUNTER — Encounter: Payer: Self-pay | Admitting: Adult Health

## 2017-08-07 ENCOUNTER — Ambulatory Visit (INDEPENDENT_AMBULATORY_CARE_PROVIDER_SITE_OTHER): Payer: Medicare Other | Admitting: Adult Health

## 2017-08-07 VITALS — BP 147/85 | HR 64 | Ht 62.0 in | Wt 194.4 lb

## 2017-08-07 DIAGNOSIS — G250 Essential tremor: Secondary | ICD-10-CM | POA: Diagnosis not present

## 2017-08-07 DIAGNOSIS — G4733 Obstructive sleep apnea (adult) (pediatric): Secondary | ICD-10-CM | POA: Diagnosis not present

## 2017-08-07 DIAGNOSIS — Z9989 Dependence on other enabling machines and devices: Secondary | ICD-10-CM

## 2017-08-07 NOTE — Patient Instructions (Signed)
Your Plan:  Continue primidone Restart CPAP- can request new supplies from DME If your symptoms worsen or you develop new symptoms please let us know.    Thank you for coming to see [place] at [facility]. I hope we have been able to provide you high quality care today.  You may receive a patient satisfaction survey over the next few weeks. We would appreciate your feedback and comments so that we may continue to improve ourselves and the health of our patients.

## 2017-08-07 NOTE — Progress Notes (Addendum)
PATIENT: Erica [name] DOB: 1932-03-14  REASON FOR VISIT: follow up HISTORY FROM: patient  HISTORY OF PRESENT ILLNESS: Today 08/07/17 Ms. Back is an 84 year old female with a history of obstructive sleep apnea on CPAP and essential tremor  She returns today for an evaluation.  Her CPAP download indicates that she has not been using her machine for the last 2 months.  She reports that she has sinusitis and then she traveled to [place] and then [place] in [place].  She states that when she travels she does not take the machine with her.  She simply states that it is too much to keep up with it.  She states that she did not restart the CPAP back because she was concerned that it was contaminated since she had sinusitis.  She does report that she restarted primodone approximately 1 month ago.  She states that she is tolerating it well.  And it is beneficial for her tremors.  She is able to complete all ADLs independently.  She returns today for an evaluation.  HISTORY I reviewed her CPAP compliance data from 11/02/2016 through 01/30/2017, which is a total of 90 days, during which time she used her machine 67 days with percent used days greater than 4 hours at 71%. In the past 30 days she had a compliance percentage of 73%, average usage of 8 hours and 3 minutes. She had skipped 8 nights out of the past 30 between 01/01/2017 and 01/30/2017. Average AHI borderline at 5.2, leak acceptable with the 95th percentile at 15.1 liters per minute on a pressure of 9 cm with EPR of 3. She reports doing well with CPAP therapy. She feels like she sleeps better with it. She does admit that she did not take it when she went to [place] in June. She is encouraged to try to take it on her trips as she does have moderate obstructive sleep apnea and it helps her sleep apnea quite well when she uses it. She has had updated supplies. Tremor has become worse. She does not recall using the Mysoline at this time. It  looks like she picked it up in March and did not pick it up in April, when we called her pharmacy for clarification. They do have refills left for her and she is encouraged to restart it if possible. She does report taking the long-acting propranolol once daily and the immediate release tablet once or twice daily.  REVIEW OF SYSTEMS: Out of a complete 14 system review of symptoms, the patient complains only of the following symptoms, and all other reviewed systems are negative.  See HPI  ALLERGIES: No Known Allergies  HOME MEDICATIONS: Outpatient Medications Prior to Visit  Medication Sig Dispense Refill  . budesonide-formoterol (SYMBICORT) 160-4.5 MCG/ACT inhaler Inhale 2 puffs into the lungs 2 (two) times daily as needed.     . cholecalciferol (VITAMIN D) 1000 UNITS tablet Take by mouth daily.    Marland Kitchen co-enzyme Q-10 30 MG capsule Take 30 mg by mouth daily.    . metFORMIN (GLUCOPHAGE) 500 MG tablet 250 mg.     . methazolamide (NEPTAZANE) 50 MG tablet Take 1 tablet (50 mg total) by mouth daily. 90 tablet 3  . Multiple Vitamins-Minerals (PRESERVISION AREDS 2 PO) Take by mouth.    Marland Kitchen omeprazole (PRILOSEC) 10 MG capsule Take 10 mg by mouth daily.    . primidone (MYSOLINE) 50 MG tablet Take 1 tablet (50 mg total) by mouth at bedtime. 90 tablet 3  .  propranolol (INDERAL) 10 MG tablet Take 1 pill up to 3 times a day as needed for tremor control (in addition to 60 mg LA). 270 tablet 3  . propranolol ER (INDERAL LA) 60 MG 24 hr capsule TAKE 1 CAPSULE DAILY. 90 capsule 0  . sertraline (ZOLOFT) 50 MG tablet Take 50 mg by mouth daily.    . simvastatin (ZOCOR) 10 MG tablet 10 mg.      No facility-administered medications prior to visit.     PAST MEDICAL HISTORY: Past Medical History:  Diagnosis Date  . Cough   . Depression   . Meralgia paresthetica   . Other and unspecified hyperlipidemia   . Right knee pain   . Tremor     PAST SURGICAL HISTORY: Past Surgical History:  Procedure Laterality  Date  . APPENDECTOMY    . LUNG BIOPSY  2008  . TONSILLECTOMY AND ADENOIDECTOMY      FAMILY HISTORY: Family History  Problem Relation Age of Onset  . Pancreatitis Mother     SOCIAL HISTORY: Social History   Socioeconomic History  . Marital status: Widowed    Spouse name: Not on file  . Number of children: 3  . Years of education: college  . Highest education level: Not on file  Social Needs  . Financial resource strain: Not on file  . Food insecurity - worry: Not on file  . Food insecurity - inability: Not on file  . Transportation needs - medical: Not on file  . Transportation needs - non-medical: Not on file  Occupational History  . Occupation: Architectural technologist: [employer]  Tobacco Use  . Smoking status: Never Smoker  . Smokeless tobacco: Never Used  Substance and Sexual Activity  . Alcohol use: No  . Drug use: No  . Sexual activity: Not on file  Other Topics Concern  . Not on file  Social History Narrative   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 Patient lives alone and she is widowed. Patient husband died from a vehicle  accident.    Right handed.      PHYSICAL EXAM  Vitals:   08/07/17 0845  BP: (!) 147/85  Pulse: 64  Weight: 194 lb 6.4 oz (88.2 kg)  Height: '5\' 2"'  (1.575 m)   Body mass index is 35.56 kg/m.  Generalized: Well developed, in no acute distress   Neurological examination  Mentation: Alert oriented to time, place, history taking. Follows all commands speech and language fluent Cranial nerve II-XII: Pupils were equal round reactive to light. Extraocular movements were full, visual field were full on confrontational test. Facial sensation and strength were normal. Uvula tongue midline. Head turning and shoulder shrug  were normal and symmetric. Motor: The motor testing reveals 5 over 5 strength of all 4 extremities. Good symmetric motor tone is noted throughout.  Sensory: Sensory testing is intact to  soft touch on all 4 extremities. No evidence of extinction is noted.  Coordination: Cerebellar testing reveals good finger-nose-finger and heel-to-shin bilaterally.  Gait and station: Gait is normal. Tandem gait is n slightly unsteady. Romberg is negative. No drift is seen.  Reflexes: Deep tendon reflexes are symmetric and normal bilaterally.   DIAGNOSTIC DATA (LABS, IMAGING, TESTING) - I reviewed patient records, labs, notes, testing and imaging myself where available.  Lab Results  Component Value Date   WBC 10.3 07/09/2008   HGB 14.3 07/09/2008   HCT 42.3 07/09/2008   MCV 90.3 07/09/2008  PLT 300 07/09/2008      Component Value Date/Time   NA 141 07/09/2008 2222   K 4.1 07/09/2008 2222   CL 108 07/09/2008 2222   CO2 22 07/09/2008 2222   GLUCOSE 129 (H) 07/09/2008 2222   BUN 8 07/09/2008 2222   CREATININE 0.73 07/09/2008 2222   CALCIUM 9.0 07/09/2008 2222   PROT 6.1 07/09/2008 2222   ALBUMIN 3.4 (L) 07/09/2008 2222   AST 19 07/09/2008 2222   ALT 19 07/09/2008 2222   ALKPHOS 67 07/09/2008 2222   BILITOT 1.1 07/09/2008 2222   GFRNONAA >60 07/09/2008 2222   GFRAA  07/09/2008 2222    >60        The eGFR has been calculated using the MDRD equation. This calculation has not been validated in all clinical situations. eGFR's persistently <60 mL/min signify possible Chronic Kidney Disease.      ASSESSMENT AND PLAN 84 y.o. year old female  has a past medical history of Cough, Depression, Meralgia paresthetica, Other and unspecified hyperlipidemia, Right knee pain, and Tremor. here with:  1.  Obstructive sleep apnea on CPAP 2.  Essential tremor  The patient will continue on primidone for tremor.  She is encouraged to restart CPAP use.  Advised that she can request new supplies from her DME company.  She is advised that if her symptoms worsen or she develops new symptoms she should let us know.  She will follow-up in 6 months or sooner if needed.    Ward Givens,  MSN, NP-C 08/07/2017, 9:07 AM [facility] [address], [place], [place] [address] [phone]  I reviewed the above note and documentation by the Nurse Practitioner and agree with the history, physical exam, assessment and plan as outlined above. I was immediately available for face-to-face consultation. Star Age, MD, PhD [facility])

## 2017-08-19 ENCOUNTER — Other Ambulatory Visit: Payer: Self-pay | Admitting: Neurology

## 2017-08-19 ENCOUNTER — Telehealth: Payer: Self-pay | Admitting: Adult Health

## 2017-08-19 DIAGNOSIS — G250 Essential tremor: Secondary | ICD-10-CM

## 2017-08-19 MED ORDER — PROPRANOLOL HCL 10 MG PO TABS: ORAL_TABLET | ORAL | 3 refills | Status: DC

## 2017-08-19 NOTE — Telephone Encounter (Signed)
Refilled Inderal 10mg  tabs (for one year).

## 2017-08-19 NOTE — Telephone Encounter (Signed)
Spoke to pt and she has the 10mg  tabs, she needed th 60mg  caps.  I relayed that prescription was sent to pharmacy at 9:41 am this am. She will call pharmacy.

## 2017-08-19 NOTE — Telephone Encounter (Signed)
Pt called she needs a refill for propranolol (INDERAL) 10 MG tablet sent to [facility] expedited now. Pt said she is leaving to go out of town at 10:30 today for 1 month. Pt did not realize she was out of this medication and would appreciate if this could be refilled asap. Pharmacy has sent refill request. Please call the pt when it has been sent. Thank you

## 2017-10-08 DIAGNOSIS — N390 Urinary tract infection, site not specified: Secondary | ICD-10-CM | POA: Diagnosis not present

## 2017-10-08 DIAGNOSIS — R739 Hyperglycemia, unspecified: Secondary | ICD-10-CM | POA: Diagnosis not present

## 2017-10-08 DIAGNOSIS — R197 Diarrhea, unspecified: Secondary | ICD-10-CM | POA: Diagnosis not present

## 2017-10-08 DIAGNOSIS — E7800 Pure hypercholesterolemia, unspecified: Secondary | ICD-10-CM | POA: Diagnosis not present

## 2017-10-15 DIAGNOSIS — R739 Hyperglycemia, unspecified: Secondary | ICD-10-CM | POA: Diagnosis not present

## 2017-10-15 DIAGNOSIS — L570 Actinic keratosis: Secondary | ICD-10-CM | POA: Diagnosis not present

## 2017-10-15 DIAGNOSIS — Z0000 Encounter for general adult medical examination without abnormal findings: Secondary | ICD-10-CM | POA: Diagnosis not present

## 2017-10-15 DIAGNOSIS — E7800 Pure hypercholesterolemia, unspecified: Secondary | ICD-10-CM | POA: Diagnosis not present

## 2017-10-16 ENCOUNTER — Other Ambulatory Visit: Payer: Self-pay | Admitting: Neurology

## 2017-10-16 DIAGNOSIS — G250 Essential tremor: Secondary | ICD-10-CM

## 2017-11-03 ENCOUNTER — Other Ambulatory Visit: Payer: Self-pay | Admitting: Neurology

## 2017-11-03 DIAGNOSIS — G250 Essential tremor: Secondary | ICD-10-CM

## 2017-11-04 ENCOUNTER — Other Ambulatory Visit: Payer: Self-pay | Admitting: Neurology

## 2017-11-04 DIAGNOSIS — G250 Essential tremor: Secondary | ICD-10-CM

## 2017-11-06 ENCOUNTER — Other Ambulatory Visit: Payer: Self-pay

## 2017-11-06 DIAGNOSIS — G250 Essential tremor: Secondary | ICD-10-CM

## 2017-11-06 MED ORDER — PROPRANOLOL HCL ER 60 MG PO CP24
60.0000 mg | ORAL_CAPSULE | Freq: Every day | ORAL | 0 refills | Status: DC
Start: 2017-11-06 — End: 2018-02-03

## 2017-11-06 NOTE — Telephone Encounter (Signed)
Received a fax from [facility] advising me that pt lost her meds while traveling and she is out of the propranolol.   I spoke with Dr. Rexene Alberts. She is agreeable to refilling the propranolol 60 mg daily.

## 2017-11-20 DIAGNOSIS — Z961 Presence of intraocular lens: Secondary | ICD-10-CM | POA: Diagnosis not present

## 2017-11-20 DIAGNOSIS — H10413 Chronic giant papillary conjunctivitis, bilateral: Secondary | ICD-10-CM | POA: Diagnosis not present

## 2017-11-20 DIAGNOSIS — H353132 Nonexudative age-related macular degeneration, bilateral, intermediate dry stage: Secondary | ICD-10-CM | POA: Diagnosis not present

## 2017-11-20 DIAGNOSIS — H31091 Other chorioretinal scars, right eye: Secondary | ICD-10-CM | POA: Diagnosis not present

## 2017-11-20 DIAGNOSIS — H04123 Dry eye syndrome of bilateral lacrimal glands: Secondary | ICD-10-CM | POA: Diagnosis not present

## 2017-11-20 DIAGNOSIS — E119 Type 2 diabetes mellitus without complications: Secondary | ICD-10-CM | POA: Diagnosis not present

## 2018-01-21 DIAGNOSIS — R42 Dizziness and giddiness: Secondary | ICD-10-CM | POA: Diagnosis not present

## 2018-01-21 DIAGNOSIS — M6281 Muscle weakness (generalized): Secondary | ICD-10-CM | POA: Diagnosis not present

## 2018-01-21 DIAGNOSIS — H8111 Benign paroxysmal vertigo, right ear: Secondary | ICD-10-CM | POA: Diagnosis not present

## 2018-01-21 DIAGNOSIS — M542 Cervicalgia: Secondary | ICD-10-CM | POA: Diagnosis not present

## 2018-01-21 DIAGNOSIS — M62838 Other muscle spasm: Secondary | ICD-10-CM | POA: Diagnosis not present

## 2018-01-29 DIAGNOSIS — M6281 Muscle weakness (generalized): Secondary | ICD-10-CM | POA: Diagnosis not present

## 2018-01-29 DIAGNOSIS — M62838 Other muscle spasm: Secondary | ICD-10-CM | POA: Diagnosis not present

## 2018-01-29 DIAGNOSIS — R42 Dizziness and giddiness: Secondary | ICD-10-CM | POA: Diagnosis not present

## 2018-01-29 DIAGNOSIS — M542 Cervicalgia: Secondary | ICD-10-CM | POA: Diagnosis not present

## 2018-01-29 DIAGNOSIS — H8111 Benign paroxysmal vertigo, right ear: Secondary | ICD-10-CM | POA: Diagnosis not present

## 2018-02-03 ENCOUNTER — Other Ambulatory Visit: Payer: Self-pay | Admitting: Neurology

## 2018-02-03 DIAGNOSIS — M6281 Muscle weakness (generalized): Secondary | ICD-10-CM | POA: Diagnosis not present

## 2018-02-03 DIAGNOSIS — G250 Essential tremor: Secondary | ICD-10-CM

## 2018-02-03 DIAGNOSIS — M62838 Other muscle spasm: Secondary | ICD-10-CM | POA: Diagnosis not present

## 2018-02-03 DIAGNOSIS — M542 Cervicalgia: Secondary | ICD-10-CM | POA: Diagnosis not present

## 2018-02-03 DIAGNOSIS — H8111 Benign paroxysmal vertigo, right ear: Secondary | ICD-10-CM | POA: Diagnosis not present

## 2018-02-03 DIAGNOSIS — R42 Dizziness and giddiness: Secondary | ICD-10-CM | POA: Diagnosis not present

## 2018-02-05 ENCOUNTER — Encounter: Payer: Self-pay | Admitting: Adult Health

## 2018-02-05 ENCOUNTER — Ambulatory Visit (INDEPENDENT_AMBULATORY_CARE_PROVIDER_SITE_OTHER): Payer: Medicare Other | Admitting: Adult Health

## 2018-02-05 VITALS — BP 154/91 | HR 59 | Ht 62.0 in | Wt 194.8 lb

## 2018-02-05 DIAGNOSIS — G4733 Obstructive sleep apnea (adult) (pediatric): Secondary | ICD-10-CM | POA: Diagnosis not present

## 2018-02-05 DIAGNOSIS — G250 Essential tremor: Secondary | ICD-10-CM | POA: Diagnosis not present

## 2018-02-05 DIAGNOSIS — Z9989 Dependence on other enabling machines and devices: Secondary | ICD-10-CM

## 2018-02-05 NOTE — Progress Notes (Addendum)
PATIENT: Erica [name] DOB: 03-04-1932  REASON FOR VISIT: follow up HISTORY FROM: patient  HISTORY OF PRESENT ILLNESS: Today 02/05/18 Erica [name] is an 84 year old female with a history of obstructive sleep apnea on CPAP and essential tremor.  She returns today for follow-up.  The patient reports that she has not been using her CPAP.  We discussed suspending treatment however she states that she knows that she "needs it."  Therefore she states that she plans to restart it.  She states that her tremor is slightly worse.  She reports the month of July has been very stressful.  She states that her tremor is a both upper extremities but now in the face as well.  She states that she usually can deep breathe and relax and the tremor does improve.  She finds it difficult to use a spoon or fork at times.  She does prefer finger foods.  She is able to complete all ADLs independently.  She operates a Teacher, music without difficulty.  She returns today for evaluation.  HISTORY 08/07/17 Erica [name] is an 84 year old female with a history of obstructive sleep apnea on CPAP and essential tremor  She returns today for an evaluation.  Her CPAP download indicates that she has not been using her machine for the last 2 months.  She reports that she has sinusitis and then she traveled to [place] and then [place] in [place].  She states that when she travels she does not take the machine with her.  She simply states that it is too much to keep up with it.  She states that she did not restart the CPAP [name] because she was concerned that it was contaminated since she had sinusitis.  She does report that she restarted primodone approximately 1 month ago.  She states that she is tolerating it well.  And it is beneficial for her tremors.  She is able to complete all ADLs independently.  She returns today for an evaluation.   REVIEW OF SYSTEMS: Out of a complete 14 system review of symptoms, the patient complains only  of the following symptoms, and all other reviewed systems are negative.  See HPI  ALLERGIES: No Known Allergies  HOME MEDICATIONS: Outpatient Medications Prior to Visit  Medication Sig Dispense Refill  . budesonide-formoterol (SYMBICORT) 160-4.5 MCG/ACT inhaler Inhale 2 puffs into the lungs 2 (two) times daily as needed.     . cholecalciferol (VITAMIN D) 1000 UNITS tablet Take by mouth daily.    Marland Kitchen co-enzyme Q-10 30 MG capsule Take 30 mg by mouth daily.    . metFORMIN (GLUCOPHAGE) 500 MG tablet 250 mg.     . methazolamide (NEPTAZANE) 50 MG tablet Take 1 tablet (50 mg total) by mouth daily. 90 tablet 3  . Multiple Vitamins-Minerals (PRESERVISION AREDS 2 PO) Take by mouth.    Marland Kitchen omeprazole (PRILOSEC) 10 MG capsule Take 10 mg by mouth daily.    . primidone (MYSOLINE) 50 MG tablet TAKE ONE TABLET AT BEDTIME. 90 tablet 0  . propranolol (INDERAL) 10 MG tablet Take 1 pill up to 3 times a day as needed for tremor control (in addition to 60 mg LA). 270 tablet 3  . propranolol ER (INDERAL LA) 60 MG 24 hr capsule TAKE 1 CAPSULE DAILY. 90 capsule 0  . sertraline (ZOLOFT) 50 MG tablet Take 50 mg by mouth daily.    . simvastatin (ZOCOR) 10 MG tablet 10 mg.      No facility-administered medications prior  to visit.     PAST MEDICAL HISTORY: Past Medical History:  Diagnosis Date  . Cough   . Depression   . Meralgia paresthetica   . Other and unspecified hyperlipidemia   . Right knee pain   . Tremor     PAST SURGICAL HISTORY: Past Surgical History:  Procedure Laterality Date  . APPENDECTOMY    . LUNG BIOPSY  2008  . TONSILLECTOMY AND ADENOIDECTOMY      FAMILY HISTORY: Family History  Problem Relation Age of Onset  . Pancreatitis Mother     SOCIAL HISTORY: Social History   Socioeconomic History  . Marital status: Widowed    Spouse name: Not on file  . Number of children: 3  . Years of education: college  . Highest education level: Not on file  Occupational History  .  Occupation: Architectural technologist: [employer]  . Financial resource strain: Not on file  . Food insecurity:    Worry: Not on file    Inability: Not on file  . Transportation needs:    Medical: Not on file    Non-medical: Not on file  Tobacco Use  . Smoking status: Never Smoker  . Smokeless tobacco: Never Used  Substance and Sexual Activity  . Alcohol use: No  . Drug use: No  . Sexual activity: Not on file  Lifestyle  . Physical activity:    Days per week: Not on file    Minutes per session: Not on file  . Stress: Not on file  Relationships  . Social connections:    Talks on phone: Not on file    Gets together: Not on file    Attends religious service: Not on file    Active member of club or organization: Not on file    Attends meetings of clubs or organizations: Not on file    Relationship status: Not on file  . Intimate partner violence:    Fear of current or ex partner: Not on file    Emotionally abused: Not on file    Physically abused: Not on file    Forced sexual activity: Not on file  Other Topics Concern  . Not on file  Social History Narrative   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 Patient lives alone and she is widowed. Patient husband died from a vehicle  accident.    Right handed.      PHYSICAL EXAM  Vitals:   02/05/18 0911  BP: (!) 154/91  Pulse: (!) 59  Weight: 194 lb 12.8 oz (88.4 kg)  Height: '5\' 2"'  (1.575 m)   Body mass index is 35.63 kg/m.  Generalized: Well developed, in no acute distress   Neurological examination  Mentation: Alert oriented to time, place, history taking. Follows all commands speech and language fluent Cranial nerve II-XII: Pupils were equal round reactive to light. Extraocular movements were full, visual field were full on confrontational test. Facial sensation and strength were normal. Uvula tongue midline. Head turning and shoulder shrug  were normal and  symmetric. Motor: The motor testing reveals 5 over 5 strength of all 4 extremities. Good symmetric motor tone is noted throughout.  Sensory: Sensory testing is intact to soft touch on all 4 extremities. No evidence of extinction is noted.  Coordination: Cerebellar testing reveals good finger-nose-finger and heel-to-shin bilaterally.  Gait and station: Gait is normal.  Reflexes: Deep tendon reflexes are symmetric and normal bilaterally.   DIAGNOSTIC  DATA (LABS, IMAGING, TESTING) - I reviewed patient records, labs, notes, testing and imaging myself where available.  Lab Results  Component Value Date   WBC 10.3 07/09/2008   HGB 14.3 07/09/2008   HCT 42.3 07/09/2008   MCV 90.3 07/09/2008   PLT 300 07/09/2008      Component Value Date/Time   NA 141 07/09/2008 2222   K 4.1 07/09/2008 2222   CL 108 07/09/2008 2222   CO2 22 07/09/2008 2222   GLUCOSE 129 (H) 07/09/2008 2222   BUN 8 07/09/2008 2222   CREATININE 0.73 07/09/2008 2222   CALCIUM 9.0 07/09/2008 2222   PROT 6.1 07/09/2008 2222   ALBUMIN 3.4 (L) 07/09/2008 2222   AST 19 07/09/2008 2222   ALT 19 07/09/2008 2222   ALKPHOS 67 07/09/2008 2222   BILITOT 1.1 07/09/2008 2222   GFRNONAA >60 07/09/2008 2222   GFRAA  07/09/2008 2222    >60        The eGFR has been calculated using the MDRD equation. This calculation has not been validated in all clinical situations. eGFR's persistently <60 mL/min signify possible Chronic Kidney Disease.      ASSESSMENT AND PLAN 84 y.o. year old female  has a past medical history of Cough, Depression, Meralgia paresthetica, Other and unspecified hyperlipidemia, Right knee pain, and Tremor. here with   1.  Essential tremor 2.  Obstructive sleep apnea on CPAP  The patient's tremor has gotten slightly worse.  We discussed increasing her dose of primidone however she states that she would like to wait and see if her stress level improves if her tremor also improves.  I am amenable to that  plan.  The patient would also like to restart using her CPAP machine.  I advised that if she decides not to use the machine consistently she should let us know so we can suspend treatment.  She voiced understanding.  She will follow-up in 6 months or sooner if needed.     Ward Givens, MSN, NP-C 02/05/2018, 9:31 AM [facility] [address], [place], [place] [address] [phone]  I reviewed the above note and documentation by the Nurse Practitioner and agree with the history, physical exam, assessment and plan as outlined above. I was immediately available for face-to-face consultation. Star Age, MD, PhD [facility])

## 2018-02-05 NOTE — Patient Instructions (Signed)
Your Plan:  Continue Propranolol and primidone If tremor does not improve we can consider increasing primidone Restart using the CPAP If your symptoms worsen or you develop new symptoms please let us know.   Thank you for coming to see [place] at [facility]. I hope we have been able to provide you high quality care today.  You may receive a patient satisfaction survey over the next few weeks. We would appreciate your feedback and comments so that we may continue to improve ourselves and the health of our patients.

## 2018-02-06 ENCOUNTER — Telehealth: Payer: Self-pay | Admitting: Adult Health

## 2018-02-06 DIAGNOSIS — H8111 Benign paroxysmal vertigo, right ear: Secondary | ICD-10-CM | POA: Diagnosis not present

## 2018-02-06 DIAGNOSIS — M6281 Muscle weakness (generalized): Secondary | ICD-10-CM | POA: Diagnosis not present

## 2018-02-06 DIAGNOSIS — M62838 Other muscle spasm: Secondary | ICD-10-CM | POA: Diagnosis not present

## 2018-02-06 DIAGNOSIS — M542 Cervicalgia: Secondary | ICD-10-CM | POA: Diagnosis not present

## 2018-02-06 DIAGNOSIS — R42 Dizziness and giddiness: Secondary | ICD-10-CM | POA: Diagnosis not present

## 2018-02-06 NOTE — Telephone Encounter (Signed)
Pt has called asking to speak with RN Lovey Newcomer re: her CPAP please call

## 2018-02-06 NOTE — Telephone Encounter (Signed)
I spoke to pt and she wanted to know what to do about her machine not turning on. I gave her the [facility] # and emailed james cain as well for her.  She will call back as needed.

## 2018-02-11 ENCOUNTER — Other Ambulatory Visit: Payer: Self-pay | Admitting: Neurology

## 2018-02-11 DIAGNOSIS — G250 Essential tremor: Secondary | ICD-10-CM

## 2018-02-13 DIAGNOSIS — L814 Other melanin hyperpigmentation: Secondary | ICD-10-CM | POA: Diagnosis not present

## 2018-02-13 DIAGNOSIS — D485 Neoplasm of uncertain behavior of skin: Secondary | ICD-10-CM | POA: Diagnosis not present

## 2018-02-13 DIAGNOSIS — L821 Other seborrheic keratosis: Secondary | ICD-10-CM | POA: Diagnosis not present

## 2018-02-13 DIAGNOSIS — L570 Actinic keratosis: Secondary | ICD-10-CM | POA: Diagnosis not present

## 2018-02-19 DIAGNOSIS — M542 Cervicalgia: Secondary | ICD-10-CM | POA: Diagnosis not present

## 2018-02-19 DIAGNOSIS — H8111 Benign paroxysmal vertigo, right ear: Secondary | ICD-10-CM | POA: Diagnosis not present

## 2018-02-19 DIAGNOSIS — M6281 Muscle weakness (generalized): Secondary | ICD-10-CM | POA: Diagnosis not present

## 2018-02-19 DIAGNOSIS — M62838 Other muscle spasm: Secondary | ICD-10-CM | POA: Diagnosis not present

## 2018-02-26 DIAGNOSIS — H8111 Benign paroxysmal vertigo, right ear: Secondary | ICD-10-CM | POA: Diagnosis not present

## 2018-02-26 DIAGNOSIS — M62838 Other muscle spasm: Secondary | ICD-10-CM | POA: Diagnosis not present

## 2018-02-26 DIAGNOSIS — M542 Cervicalgia: Secondary | ICD-10-CM | POA: Diagnosis not present

## 2018-02-26 DIAGNOSIS — M6281 Muscle weakness (generalized): Secondary | ICD-10-CM | POA: Diagnosis not present

## 2018-03-06 DIAGNOSIS — M6281 Muscle weakness (generalized): Secondary | ICD-10-CM | POA: Diagnosis not present

## 2018-03-06 DIAGNOSIS — M542 Cervicalgia: Secondary | ICD-10-CM | POA: Diagnosis not present

## 2018-03-06 DIAGNOSIS — H8111 Benign paroxysmal vertigo, right ear: Secondary | ICD-10-CM | POA: Diagnosis not present

## 2018-03-06 DIAGNOSIS — R42 Dizziness and giddiness: Secondary | ICD-10-CM | POA: Diagnosis not present

## 2018-04-23 DIAGNOSIS — E7800 Pure hypercholesterolemia, unspecified: Secondary | ICD-10-CM | POA: Diagnosis not present

## 2018-04-23 DIAGNOSIS — R739 Hyperglycemia, unspecified: Secondary | ICD-10-CM | POA: Diagnosis not present

## 2018-04-29 DIAGNOSIS — K219 Gastro-esophageal reflux disease without esophagitis: Secondary | ICD-10-CM | POA: Diagnosis not present

## 2018-04-29 DIAGNOSIS — E7800 Pure hypercholesterolemia, unspecified: Secondary | ICD-10-CM | POA: Diagnosis not present

## 2018-04-29 DIAGNOSIS — F419 Anxiety disorder, unspecified: Secondary | ICD-10-CM | POA: Diagnosis not present

## 2018-04-29 DIAGNOSIS — G250 Essential tremor: Secondary | ICD-10-CM | POA: Diagnosis not present

## 2018-04-29 DIAGNOSIS — E119 Type 2 diabetes mellitus without complications: Secondary | ICD-10-CM | POA: Diagnosis not present

## 2018-04-29 DIAGNOSIS — I10 Essential (primary) hypertension: Secondary | ICD-10-CM | POA: Diagnosis not present

## 2018-05-03 ENCOUNTER — Other Ambulatory Visit: Payer: Self-pay | Admitting: Neurology

## 2018-05-03 DIAGNOSIS — G250 Essential tremor: Secondary | ICD-10-CM

## 2018-05-05 ENCOUNTER — Telehealth: Payer: Self-pay | Admitting: Adult Health

## 2018-05-05 DIAGNOSIS — G4733 Obstructive sleep apnea (adult) (pediatric): Secondary | ICD-10-CM

## 2018-05-05 DIAGNOSIS — Z9989 Dependence on other enabling machines and devices: Principal | ICD-10-CM

## 2018-05-05 NOTE — Telephone Encounter (Signed)
Community message sent to Darius Bump, [facility] re: Epic order for new supplies.

## 2018-05-05 NOTE — Telephone Encounter (Signed)
Patient calling to get a Rx for a new headgear sent to [facility].

## 2018-05-05 NOTE — Telephone Encounter (Addendum)
Called patient who stated the rubber piece that goes around her head is so stretched out it will not keep her mask in place.  She has been unable to use CPAP recently and stated she "misses it".  [facility] has told her they need Rx for new headgear piece. This RN advised will send request to NP. She verbalized understanding, appreciation.

## 2018-05-05 NOTE — Addendum Note (Signed)
Addended by: Trudie Buckler on: 05/05/2018 01:35 PM   Modules accepted: Orders

## 2018-05-19 ENCOUNTER — Other Ambulatory Visit: Payer: Self-pay | Admitting: Neurology

## 2018-05-19 DIAGNOSIS — G250 Essential tremor: Secondary | ICD-10-CM

## 2018-06-17 DIAGNOSIS — H8111 Benign paroxysmal vertigo, right ear: Secondary | ICD-10-CM | POA: Diagnosis not present

## 2018-06-17 DIAGNOSIS — M542 Cervicalgia: Secondary | ICD-10-CM | POA: Diagnosis not present

## 2018-06-17 DIAGNOSIS — R42 Dizziness and giddiness: Secondary | ICD-10-CM | POA: Diagnosis not present

## 2018-06-17 DIAGNOSIS — M62838 Other muscle spasm: Secondary | ICD-10-CM | POA: Diagnosis not present

## 2018-06-17 DIAGNOSIS — M6281 Muscle weakness (generalized): Secondary | ICD-10-CM | POA: Diagnosis not present

## 2018-06-20 DIAGNOSIS — M542 Cervicalgia: Secondary | ICD-10-CM | POA: Diagnosis not present

## 2018-06-20 DIAGNOSIS — M62838 Other muscle spasm: Secondary | ICD-10-CM | POA: Diagnosis not present

## 2018-06-20 DIAGNOSIS — R42 Dizziness and giddiness: Secondary | ICD-10-CM | POA: Diagnosis not present

## 2018-06-20 DIAGNOSIS — M6281 Muscle weakness (generalized): Secondary | ICD-10-CM | POA: Diagnosis not present

## 2018-07-03 DIAGNOSIS — M62838 Other muscle spasm: Secondary | ICD-10-CM | POA: Diagnosis not present

## 2018-07-03 DIAGNOSIS — M6281 Muscle weakness (generalized): Secondary | ICD-10-CM | POA: Diagnosis not present

## 2018-07-03 DIAGNOSIS — R42 Dizziness and giddiness: Secondary | ICD-10-CM | POA: Diagnosis not present

## 2018-07-03 DIAGNOSIS — M542 Cervicalgia: Secondary | ICD-10-CM | POA: Diagnosis not present

## 2018-07-06 ENCOUNTER — Encounter: Payer: Self-pay | Admitting: Adult Health

## 2018-07-08 ENCOUNTER — Encounter: Payer: Self-pay | Admitting: Adult Health

## 2018-07-08 ENCOUNTER — Ambulatory Visit (INDEPENDENT_AMBULATORY_CARE_PROVIDER_SITE_OTHER): Payer: Medicare Other | Admitting: Adult Health

## 2018-07-08 VITALS — BP 146/68 | HR 69 | Ht 62.0 in | Wt 198.6 lb

## 2018-07-08 DIAGNOSIS — G250 Essential tremor: Secondary | ICD-10-CM

## 2018-07-08 DIAGNOSIS — Z9989 Dependence on other enabling machines and devices: Secondary | ICD-10-CM

## 2018-07-08 DIAGNOSIS — G4733 Obstructive sleep apnea (adult) (pediatric): Secondary | ICD-10-CM

## 2018-07-08 NOTE — Progress Notes (Addendum)
PATIENT: Erica [name] DOB: 07-30-1931  REASON FOR VISIT: follow up HISTORY FROM: patient  HISTORY OF PRESENT ILLNESS: Today 07/08/18:  Erica [name] is an 85 year old female with a history of obstructive sleep apnea on CPAP and essential tremor.  She returns today for follow-up.  Her CPAP download indicates that she used her machine 26 out of 30 days for compliance of 87%.  She used her machine greater than 4 hours each night.  On average she uses her machine 9 hours and 3 minutes.  Her residual AHI is 3 on 9 cmH2O with EPR 3.  She does not have a significant leak.  She does report that she has seen the benefit of using the CPAP machine.  Her fatigue severity score is 15 and her Epworth sleepiness score is 8.  She reports that her tremor has remained relatively stable.  She continues on primidone and propranolol.  She notices it when she writes and with eating.  She states that she typically has a hard time eating soup due to the tremor.  She is able to complete all ADLs with out assistance.  She returns today for evaluation.  HISTORY 02/05/18 Erica [name] is an 85 year old female with a history of obstructive sleep apnea on CPAP and essential tremor.  She returns today for follow-up.  The patient reports that she has not been using her CPAP.  We discussed suspending treatment however she states that she knows that she "needs it."  Therefore she states that she plans to restart it.  She states that her tremor is slightly worse.  She reports the month of July has been very stressful.  She states that her tremor is a both upper extremities but now in the face as well.  She states that she usually can deep breathe and relax and the tremor does improve.  She finds it difficult to use a spoon or fork at times.  She does prefer finger foods.  She is able to complete all ADLs independently.  She operates a Teacher, music without difficulty.  She returns today for evaluation.  REVIEW OF SYSTEMS: Out of a  complete 14 system review of symptoms, the patient complains only of the following symptoms, and all other reviewed systems are negative.  Tremors, eye itching  ALLERGIES: No Known Allergies  HOME MEDICATIONS: Outpatient Medications Prior to Visit  Medication Sig Dispense Refill  . budesonide-formoterol (SYMBICORT) 160-4.5 MCG/ACT inhaler Inhale 2 puffs into the lungs 2 (two) times daily as needed.     . cholecalciferol (VITAMIN D) 1000 UNITS tablet Take by mouth daily.    Marland Kitchen co-enzyme Q-10 30 MG capsule Take 30 mg by mouth daily.    . metFORMIN (GLUCOPHAGE) 500 MG tablet 250 mg.     . methazolamide (NEPTAZANE) 50 MG tablet Take 1 tablet (50 mg total) by mouth daily. 90 tablet 3  . Multiple Vitamins-Minerals (PRESERVISION AREDS 2 PO) Take by mouth.    Marland Kitchen omeprazole (PRILOSEC) 10 MG capsule Take 10 mg by mouth daily.    . primidone (MYSOLINE) 50 MG tablet TAKE ONE TABLET AT BEDTIME. 90 tablet 0  . propranolol (INDERAL) 10 MG tablet Take 1 pill up to 3 times a day as needed for tremor control (in addition to 60 mg LA). 270 tablet 3  . propranolol ER (INDERAL LA) 60 MG 24 hr capsule TAKE 1 CAPSULE DAILY. 90 capsule 0  . sertraline (ZOLOFT) 50 MG tablet Take 50 mg by mouth daily.    Marland Kitchen  simvastatin (ZOCOR) 10 MG tablet 10 mg.      No facility-administered medications prior to visit.     PAST MEDICAL HISTORY: Past Medical History:  Diagnosis Date  . Cough   . Depression   . Meralgia paresthetica   . Other and unspecified hyperlipidemia   . Right knee pain   . Tremor     PAST SURGICAL HISTORY: Past Surgical History:  Procedure Laterality Date  . APPENDECTOMY    . LUNG BIOPSY  2008  . TONSILLECTOMY AND ADENOIDECTOMY      FAMILY HISTORY: Family History  Problem Relation Age of Onset  . Pancreatitis Mother     SOCIAL HISTORY: Social History   Socioeconomic History  . Marital status: Widowed    Spouse name: Not on file  . Number of children: 3  . Years of education:  college  . Highest education level: Not on file  Occupational History  . Occupation: Architectural technologist: [employer]  . Financial resource strain: Not on file  . Food insecurity:    Worry: Not on file    Inability: Not on file  . Transportation needs:    Medical: Not on file    Non-medical: Not on file  Tobacco Use  . Smoking status: Never Smoker  . Smokeless tobacco: Never Used  Substance and Sexual Activity  . Alcohol use: No  . Drug use: No  . Sexual activity: Not on file  Lifestyle  . Physical activity:    Days per week: Not on file    Minutes per session: Not on file  . Stress: Not on file  Relationships  . Social connections:    Talks on phone: Not on file    Gets together: Not on file    Attends religious service: Not on file    Active member of club or organization: Not on file    Attends meetings of clubs or organizations: Not on file    Relationship status: Not on file  . Intimate partner violence:    Fear of current or ex partner: Not on file    Emotionally abused: Not on file    Physically abused: Not on file    Forced sexual activity: Not on file  Other Topics Concern  . Not on file  Social History Narrative   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 Patient lives alone and she is widowed. Patient husband died from a vehicle  accident.    Right handed.      PHYSICAL EXAM  Vitals:   07/08/18 1043  BP: (!) 146/68  Pulse: 69  Weight: 198 lb 9.6 oz (90.1 kg)  Height: '5\' 2"'  (1.575 m)   Body mass index is 36.32 kg/m.  Generalized: Well developed, in no acute distress   Neurological examination  Mentation: Alert oriented to time, place, history taking. Follows all commands speech and language fluent Cranial nerve II-XII: Pupils were equal round reactive to light. Extraocular movements were full, visual field were full on confrontational test. Facial sensation and strength were normal. Uvula tongue  midline. Head turning and shoulder shrug  were normal and symmetric.  Tremor in head and neck noted. Motor: The motor testing reveals 5 over 5 strength of all 4 extremities. Good symmetric motor tone is noted throughout.  Sensory: Sensory testing is intact to soft touch on all 4 extremities. No evidence of extinction is noted.  Coordination: Cerebellar testing reveals good finger-nose-finger and  heel-to-shin bilaterally.  Intention tremor in the upper extremities. Gait and station: Gait is normal.  Reflexes: Deep tendon reflexes are symmetric and normal bilaterally.   DIAGNOSTIC DATA (LABS, IMAGING, TESTING) - I reviewed patient records, labs, notes, testing and imaging myself where available.  Lab Results  Component Value Date   WBC 10.3 07/09/2008   HGB 14.3 07/09/2008   HCT 42.3 07/09/2008   MCV 90.3 07/09/2008   PLT 300 07/09/2008      Component Value Date/Time   NA 141 07/09/2008 2222   K 4.1 07/09/2008 2222   CL 108 07/09/2008 2222   CO2 22 07/09/2008 2222   GLUCOSE 129 (H) 07/09/2008 2222   BUN 8 07/09/2008 2222   CREATININE 0.73 07/09/2008 2222   CALCIUM 9.0 07/09/2008 2222   PROT 6.1 07/09/2008 2222   ALBUMIN 3.4 (L) 07/09/2008 2222   AST 19 07/09/2008 2222   ALT 19 07/09/2008 2222   ALKPHOS 67 07/09/2008 2222   BILITOT 1.1 07/09/2008 2222   GFRNONAA >60 07/09/2008 2222   GFRAA  07/09/2008 2222    >60        The eGFR has been calculated using the MDRD equation. This calculation has not been validated in all clinical situations. eGFR's persistently <60 mL/min signify possible Chronic Kidney Disease.   No results found for: CHOL, HDL, LDLCALC, LDLDIRECT, TRIG, CHOLHDL No results found for: HGBA1C No results found for: VITAMINB12 No results found for: TSH    ASSESSMENT AND PLAN 85 y.o. year old female  has a past medical history of Cough, Depression, Meralgia paresthetica, Other and unspecified hyperlipidemia, Right knee pain, and Tremor. here with :  1.   Essential tremor 2.  Obstructive sleep apnea on CPAP  The patient's tremor has remained relatively stable.  She will continue on primidone and propranolol.  Her CPAP download shows good compliance and treatment of her apnea.  She is encouraged to continue using the CPAP nightly and greater than 4 hours each night.  She is advised that if her symptoms worsen or she develops new symptoms she should let us know.  She will follow-up in 6 months or sooner if needed.     Ward Givens, MSN, NP-C 07/08/2018, 11:25 AM [facility] [address], [place], [place] [address] [phone]  I reviewed the above note and documentation by the Nurse Practitioner and agree with the history, physical exam, assessment and plan as outlined above. I was immediately available for face-to-face consultation. Star Age, MD, PhD [facility])

## 2018-07-08 NOTE — Patient Instructions (Signed)
Your Plan:  Continue Primidone and Propranolol  Continue using CPAP nightly  If your symptoms worsen or you develop new symptoms please let us know.   Thank you for coming to see [place] at [facility]. I hope we have been able to provide you high quality care today.  You may receive a patient satisfaction survey over the next few weeks. We would appreciate your feedback and comments so that we may continue to improve ourselves and the health of our patients.

## 2018-07-14 DIAGNOSIS — R195 Other fecal abnormalities: Secondary | ICD-10-CM | POA: Diagnosis not present

## 2018-07-14 DIAGNOSIS — K219 Gastro-esophageal reflux disease without esophagitis: Secondary | ICD-10-CM | POA: Diagnosis not present

## 2018-07-14 DIAGNOSIS — E119 Type 2 diabetes mellitus without complications: Secondary | ICD-10-CM | POA: Diagnosis not present

## 2018-07-14 DIAGNOSIS — R109 Unspecified abdominal pain: Secondary | ICD-10-CM | POA: Diagnosis not present

## 2018-07-16 DIAGNOSIS — R42 Dizziness and giddiness: Secondary | ICD-10-CM | POA: Diagnosis not present

## 2018-07-16 DIAGNOSIS — M542 Cervicalgia: Secondary | ICD-10-CM | POA: Diagnosis not present

## 2018-07-16 DIAGNOSIS — M6281 Muscle weakness (generalized): Secondary | ICD-10-CM | POA: Diagnosis not present

## 2018-07-16 DIAGNOSIS — M62838 Other muscle spasm: Secondary | ICD-10-CM | POA: Diagnosis not present

## 2018-07-16 DIAGNOSIS — H8111 Benign paroxysmal vertigo, right ear: Secondary | ICD-10-CM | POA: Diagnosis not present

## 2018-07-25 DIAGNOSIS — M542 Cervicalgia: Secondary | ICD-10-CM | POA: Diagnosis not present

## 2018-07-25 DIAGNOSIS — M62838 Other muscle spasm: Secondary | ICD-10-CM | POA: Diagnosis not present

## 2018-07-25 DIAGNOSIS — R42 Dizziness and giddiness: Secondary | ICD-10-CM | POA: Diagnosis not present

## 2018-07-25 DIAGNOSIS — H8111 Benign paroxysmal vertigo, right ear: Secondary | ICD-10-CM | POA: Diagnosis not present

## 2018-07-25 DIAGNOSIS — M6281 Muscle weakness (generalized): Secondary | ICD-10-CM | POA: Diagnosis not present

## 2018-07-28 DIAGNOSIS — R1032 Left lower quadrant pain: Secondary | ICD-10-CM | POA: Diagnosis not present

## 2018-07-28 DIAGNOSIS — R109 Unspecified abdominal pain: Secondary | ICD-10-CM | POA: Diagnosis not present

## 2018-07-28 DIAGNOSIS — E7800 Pure hypercholesterolemia, unspecified: Secondary | ICD-10-CM | POA: Diagnosis not present

## 2018-07-29 ENCOUNTER — Other Ambulatory Visit: Payer: Self-pay | Admitting: Adult Health

## 2018-07-29 DIAGNOSIS — G250 Essential tremor: Secondary | ICD-10-CM

## 2018-07-31 DIAGNOSIS — R1032 Left lower quadrant pain: Secondary | ICD-10-CM | POA: Diagnosis not present

## 2018-08-11 DIAGNOSIS — M62838 Other muscle spasm: Secondary | ICD-10-CM | POA: Diagnosis not present

## 2018-08-11 DIAGNOSIS — H8111 Benign paroxysmal vertigo, right ear: Secondary | ICD-10-CM | POA: Diagnosis not present

## 2018-08-11 DIAGNOSIS — E119 Type 2 diabetes mellitus without complications: Secondary | ICD-10-CM | POA: Diagnosis not present

## 2018-08-11 DIAGNOSIS — R1032 Left lower quadrant pain: Secondary | ICD-10-CM | POA: Diagnosis not present

## 2018-08-11 DIAGNOSIS — M542 Cervicalgia: Secondary | ICD-10-CM | POA: Diagnosis not present

## 2018-08-11 DIAGNOSIS — M6281 Muscle weakness (generalized): Secondary | ICD-10-CM | POA: Diagnosis not present

## 2018-08-11 DIAGNOSIS — G250 Essential tremor: Secondary | ICD-10-CM | POA: Diagnosis not present

## 2018-08-18 ENCOUNTER — Other Ambulatory Visit: Payer: Self-pay | Admitting: Neurology

## 2018-08-18 DIAGNOSIS — G250 Essential tremor: Secondary | ICD-10-CM

## 2018-09-23 DIAGNOSIS — I10 Essential (primary) hypertension: Secondary | ICD-10-CM | POA: Diagnosis not present

## 2018-09-23 DIAGNOSIS — K219 Gastro-esophageal reflux disease without esophagitis: Secondary | ICD-10-CM | POA: Diagnosis not present

## 2018-09-23 DIAGNOSIS — Z7984 Long term (current) use of oral hypoglycemic drugs: Secondary | ICD-10-CM | POA: Diagnosis not present

## 2018-09-23 DIAGNOSIS — R1032 Left lower quadrant pain: Secondary | ICD-10-CM | POA: Diagnosis not present

## 2018-09-23 DIAGNOSIS — G250 Essential tremor: Secondary | ICD-10-CM | POA: Diagnosis not present

## 2018-09-23 DIAGNOSIS — E119 Type 2 diabetes mellitus without complications: Secondary | ICD-10-CM | POA: Diagnosis not present

## 2018-09-25 ENCOUNTER — Other Ambulatory Visit: Payer: Self-pay | Admitting: Gastroenterology

## 2018-09-25 DIAGNOSIS — K5792 Diverticulitis of intestine, part unspecified, without perforation or abscess without bleeding: Secondary | ICD-10-CM

## 2018-09-26 ENCOUNTER — Ambulatory Visit
Admission: RE | Admit: 2018-09-26 | Discharge: 2018-09-26 | Disposition: A | Discharge status: Home / Self Care | Payer: Medicare Other | Source: Ambulatory Visit | Attending: Gastroenterology | Admitting: Gastroenterology

## 2018-09-26 ENCOUNTER — Other Ambulatory Visit: Payer: Self-pay

## 2018-09-26 DIAGNOSIS — K5720 Diverticulitis of large intestine with perforation and abscess without bleeding: Secondary | ICD-10-CM | POA: Diagnosis not present

## 2018-09-26 DIAGNOSIS — K5792 Diverticulitis of intestine, part unspecified, without perforation or abscess without bleeding: Secondary | ICD-10-CM

## 2018-09-26 MED ORDER — IOPAMIDOL (ISOVUE-300) INJECTION 61%
100.0000 mL | Freq: Once | INTRAVENOUS | Status: AC | PRN
  Administered 2018-09-26: 100 mL via INTRAVENOUS

## 2018-10-06 DIAGNOSIS — K5792 Diverticulitis of intestine, part unspecified, without perforation or abscess without bleeding: Secondary | ICD-10-CM | POA: Diagnosis not present

## 2018-10-23 DIAGNOSIS — E7800 Pure hypercholesterolemia, unspecified: Secondary | ICD-10-CM | POA: Diagnosis not present

## 2018-10-23 DIAGNOSIS — E119 Type 2 diabetes mellitus without complications: Secondary | ICD-10-CM | POA: Diagnosis not present

## 2018-10-23 DIAGNOSIS — I10 Essential (primary) hypertension: Secondary | ICD-10-CM | POA: Diagnosis not present

## 2018-10-30 ENCOUNTER — Telehealth: Payer: Self-pay

## 2018-10-30 DIAGNOSIS — E119 Type 2 diabetes mellitus without complications: Secondary | ICD-10-CM | POA: Diagnosis not present

## 2018-10-30 DIAGNOSIS — E7800 Pure hypercholesterolemia, unspecified: Secondary | ICD-10-CM | POA: Diagnosis not present

## 2018-10-30 DIAGNOSIS — K219 Gastro-esophageal reflux disease without esophagitis: Secondary | ICD-10-CM | POA: Diagnosis not present

## 2018-10-30 DIAGNOSIS — I10 Essential (primary) hypertension: Secondary | ICD-10-CM | POA: Diagnosis not present

## 2018-10-30 DIAGNOSIS — Z0000 Encounter for general adult medical examination without abnormal findings: Secondary | ICD-10-CM | POA: Diagnosis not present

## 2018-10-30 NOTE — Telephone Encounter (Signed)
10-30-18 Pt has called and gave verbal consent to file insurance for [email or web address] VV on 11-05-18 pt email address is:[email or web address]  Pt understands that although there may be some limitations with this type of visit, we will take all precautions to reduce any security or privacy concerns.  Pt understands that this will be treated like an in office visit and we will file with pt's insurance, and there may be a patient responsible charge related to this service.

## 2018-10-30 NOTE — Telephone Encounter (Signed)
Unable to get in contact with the patient to convert her office visit into a [email or web address] visit with Megan on 11/05/2018. I left a voicemail asking her to return to my call. Office number was provided.

## 2018-10-30 NOTE — Telephone Encounter (Signed)
E-mail has been sent with instructions on how to access her appt via [email or web address] with Megan on 11/05/2018 at 1:00 PM.

## 2018-11-03 ENCOUNTER — Encounter: Payer: Self-pay | Admitting: Neurology

## 2018-11-04 ENCOUNTER — Other Ambulatory Visit: Payer: Self-pay | Admitting: Neurology

## 2018-11-04 DIAGNOSIS — G250 Essential tremor: Secondary | ICD-10-CM

## 2018-11-04 NOTE — Progress Notes (Addendum)
PATIENT: Erica [name] DOB: 08-May-1932  REASON FOR VISIT: follow up HISTORY FROM: patient  Virtual Visit via Video Note  I connected with Erica [name] on 11/05/18 at  1:00 PM EDT by a video enabled telemedicine application located remotely at [facility] and verified that I am speaking with the correct person using two identifiers who was located at their own home.   I discussed the limitations of evaluation and management by telemedicine and the availability of in person appointments. The patient expressed understanding and agreed to proceed.   PATIENT: Erica [name] DOB: 04/28/1932  REASON FOR VISIT: follow up HISTORY FROM: patient  HISTORY OF PRESENT ILLNESS: Today 11/04/18:  Erica [name] is an 85 year old female with a history of obstructive sleep apnea on CPAP and essential tremor.  She is being seen today for a virtual visit.  Her download indicates that she use her machine 30 out of 30 days for compliance of 100%.  She used her machine greater than 4 hours each night.  On average she uses her machine 9 hours and 25 minutes.  Her residual AHI is 3.4 on 9 cm of water.  She does have a leak in the 95th percentile at 27.6 L/min.  She states that she just received new supplies but has not changed them out yet.  She feels that her essential tremor has remained stable.  The tremor primarily affects her hands and her neck.  She states that she has been using CBD oil and thinks that it is offer her some benefit.  She continues on propranolol and primidone.  Marland Kitchen  HISTORY 07/08/18:  Erica [name] is an 85 year old female with a history of obstructive sleep apnea on CPAP and essential tremor.  She returns today for follow-up.  Her CPAP download indicates that she used her machine 26 out of 30 days for compliance of 87%.  She used her machine greater than 4 hours each night.  On average she uses her machine 9 hours and 3 minutes.  Her residual AHI is 3 on 9 cmH2O with EPR  3.  She does not have a significant leak.  She does report that she has seen the benefit of using the CPAP machine.  Her fatigue severity score is 15 and her Epworth sleepiness score is 8.  She reports that her tremor has remained relatively stable.  She continues on primidone and propranolol.  She notices it when she writes and with eating.  She states that she typically has a hard time eating soup due to the tremor.  She is able to complete all ADLs with out assistance.  She returns today for evaluation.  REVIEW OF SYSTEMS: Out of a complete 14 system review of symptoms, the patient complains only of the following symptoms, and all other reviewed systems are negative.  See HPI ALLERGIES: No Known Allergies  HOME MEDICATIONS: Outpatient Medications Prior to Visit  Medication Sig Dispense Refill   budesonide-formoterol (SYMBICORT) 160-4.5 MCG/ACT inhaler Inhale 2 puffs into the lungs 2 (two) times daily as needed.      cholecalciferol (VITAMIN D) 1000 UNITS tablet Take by mouth daily.     co-enzyme Q-10 30 MG capsule Take 30 mg by mouth daily.     metFORMIN (GLUCOPHAGE) 500 MG tablet 250 mg.      methazolamide (NEPTAZANE) 50 MG tablet Take 1 tablet (50 mg total) by mouth daily. 90 tablet 3   Multiple Vitamins-Minerals (PRESERVISION AREDS 2 PO) Take by mouth.  omeprazole (PRILOSEC) 10 MG capsule Take 10 mg by mouth daily.     primidone (MYSOLINE) 50 MG tablet TAKE ONE TABLET AT BEDTIME. 90 tablet 0   propranolol (INDERAL) 10 MG tablet Take 1 pill up to 3 times a day as needed for tremor control (in addition to 60 mg LA). 270 tablet 3   propranolol ER (INDERAL LA) 60 MG 24 hr capsule TAKE 1 CAPSULE DAILY. 90 capsule 0   sertraline (ZOLOFT) 50 MG tablet Take 50 mg by mouth daily.     simvastatin (ZOCOR) 10 MG tablet 10 mg.      No facility-administered medications prior to visit.     PAST MEDICAL HISTORY: Past Medical History:  Diagnosis Date   Cough    Depression     Meralgia paresthetica    Other and unspecified hyperlipidemia    Right knee pain    Tremor     PAST SURGICAL HISTORY: Past Surgical History:  Procedure Laterality Date   APPENDECTOMY     LUNG BIOPSY  2008   TONSILLECTOMY AND ADENOIDECTOMY      FAMILY HISTORY: Family History  Problem Relation Age of Onset   Pancreatitis Mother     SOCIAL HISTORY: Social History   Socioeconomic History   Marital status: Widowed    Spouse name: Not on file   Number of children: 3   Years of education: college   Highest education level: Not on file  Occupational History   Occupation: Architectural technologist: [employer] resource strain: Not on file   Food insecurity:    Worry: Not on file    Inability: Not on file   Transportation needs:    Medical: Not on file    Non-medical: Not on file  Tobacco Use   Smoking status: Never Smoker   Smokeless tobacco: Never Used  Substance and Sexual Activity   Alcohol use: No   Drug use: No   Sexual activity: Not on file  Lifestyle   Physical activity:    Days per week: Not on file    Minutes per session: Not on file   Stress: Not on file  Relationships   Social connections:    Talks on phone: Not on file    Gets together: Not on file    Attends religious service: Not on file    Active member of club or organization: Not on file    Attends meetings of clubs or organizations: Not on file    Relationship status: Not on file   Intimate partner violence:    Fear of current or ex partner: Not on file    Emotionally abused: Not on file    Physically abused: Not on file    Forced sexual activity: Not on file  Other Topics Concern   Not on file  Social History Narrative   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 Patient lives alone and she is widowed. Patient husband died from a vehicle  accident.    Right handed.      PHYSICAL EXAM   Generalized: Well  developed, in no acute distress   Neurological examination  Mentation: Alert oriented to time, place, history taking. Follows all commands speech and language fluent Cranial nerve II-XII: . Extraocular movements were full, visual field were full on confrontational test. Facial symmetry noted. uvula tongue midline. Head turning and shoulder shrug  were normal and symmetric. Motor: Good strength noted  throughout.  Tremor noted in the neck and in both hands at the camera was shaking.   Sensory: Sensory testing is intact to soft touch on all 4 extremities subjectively per patient Coordination: Cerebellar testing reveals good finger-nose-finger  Gait and station: Gait is normal.  Reflexes: UTA   DIAGNOSTIC DATA (LABS, IMAGING, TESTING) - I reviewed patient records, labs, notes, testing and imaging myself where available.   ASSESSMENT AND PLAN 85 y.o. year old female  has a past medical history of Cough, Depression, Meralgia paresthetica, Other and unspecified hyperlipidemia, Right knee pain, and Tremor. here with :  1.  Obstructive sleep apnea on CPAP 2.  Essential tremor  The patient CPAP download shows excellent compliance and good treatment of her apnea.  She is encouraged to continue using the CPAP nightly.  Her tremor is stable.  She will remain on primidone and propranolol.  She will follow-up in 6 months or sooner if needed.   I spent 15 minutes with the patient this time was spent reviewing her History, completing exam, assessment and plan of care.   Ward Givens, MSN, NP-C 11/04/2018, 3:28 PM [facility] [address], [place], [place] [address] [phone]  I reviewed the above note and documentation by the Nurse Practitioner and agree with the history, exam, assessment and plan as outlined above. I was immediately available for face-to-face consultation. Star Age, MD, PhD [facility])

## 2018-11-05 ENCOUNTER — Encounter: Payer: Self-pay | Admitting: Adult Health

## 2018-11-05 ENCOUNTER — Ambulatory Visit (INDEPENDENT_AMBULATORY_CARE_PROVIDER_SITE_OTHER): Payer: Medicare Other | Admitting: Adult Health

## 2018-11-05 ENCOUNTER — Other Ambulatory Visit: Payer: Self-pay

## 2018-11-05 DIAGNOSIS — G4733 Obstructive sleep apnea (adult) (pediatric): Secondary | ICD-10-CM

## 2018-11-05 DIAGNOSIS — G250 Essential tremor: Secondary | ICD-10-CM | POA: Diagnosis not present

## 2018-11-05 DIAGNOSIS — Z9989 Dependence on other enabling machines and devices: Secondary | ICD-10-CM

## 2018-11-17 ENCOUNTER — Other Ambulatory Visit: Payer: Self-pay | Admitting: Adult Health

## 2018-11-17 DIAGNOSIS — G250 Essential tremor: Secondary | ICD-10-CM

## 2018-11-19 ENCOUNTER — Telehealth: Payer: Self-pay

## 2018-11-19 NOTE — Telephone Encounter (Signed)
Follow up has been scheduled. Patient is aware of appt day and time.   

## 2018-11-25 DIAGNOSIS — E119 Type 2 diabetes mellitus without complications: Secondary | ICD-10-CM | POA: Diagnosis not present

## 2018-11-25 DIAGNOSIS — H10413 Chronic giant papillary conjunctivitis, bilateral: Secondary | ICD-10-CM | POA: Diagnosis not present

## 2018-11-25 DIAGNOSIS — H31091 Other chorioretinal scars, right eye: Secondary | ICD-10-CM | POA: Diagnosis not present

## 2018-11-25 DIAGNOSIS — H353132 Nonexudative age-related macular degeneration, bilateral, intermediate dry stage: Secondary | ICD-10-CM | POA: Diagnosis not present

## 2018-11-25 DIAGNOSIS — Z961 Presence of intraocular lens: Secondary | ICD-10-CM | POA: Diagnosis not present

## 2018-11-25 DIAGNOSIS — H04123 Dry eye syndrome of bilateral lacrimal glands: Secondary | ICD-10-CM | POA: Diagnosis not present

## 2018-12-09 DIAGNOSIS — N8110 Cystocele, unspecified: Secondary | ICD-10-CM | POA: Diagnosis not present

## 2018-12-09 DIAGNOSIS — N814 Uterovaginal prolapse, unspecified: Secondary | ICD-10-CM | POA: Diagnosis not present

## 2018-12-16 DIAGNOSIS — N8112 Cystocele, lateral: Secondary | ICD-10-CM | POA: Diagnosis not present

## 2018-12-16 DIAGNOSIS — Z1231 Encounter for screening mammogram for malignant neoplasm of breast: Secondary | ICD-10-CM | POA: Diagnosis not present

## 2018-12-16 DIAGNOSIS — N3946 Mixed incontinence: Secondary | ICD-10-CM | POA: Diagnosis not present

## 2018-12-16 DIAGNOSIS — N8110 Cystocele, unspecified: Secondary | ICD-10-CM | POA: Diagnosis not present

## 2018-12-22 DIAGNOSIS — M67911 Unspecified disorder of synovium and tendon, right shoulder: Secondary | ICD-10-CM | POA: Diagnosis not present

## 2018-12-22 DIAGNOSIS — M24811 Other specific joint derangements of right shoulder, not elsewhere classified: Secondary | ICD-10-CM | POA: Diagnosis not present

## 2019-01-01 DIAGNOSIS — L570 Actinic keratosis: Secondary | ICD-10-CM | POA: Diagnosis not present

## 2019-01-06 DIAGNOSIS — N8110 Cystocele, unspecified: Secondary | ICD-10-CM | POA: Diagnosis not present

## 2019-01-06 DIAGNOSIS — N3946 Mixed incontinence: Secondary | ICD-10-CM | POA: Diagnosis not present

## 2019-01-16 DIAGNOSIS — H8111 Benign paroxysmal vertigo, right ear: Secondary | ICD-10-CM | POA: Diagnosis not present

## 2019-01-16 DIAGNOSIS — M62838 Other muscle spasm: Secondary | ICD-10-CM | POA: Diagnosis not present

## 2019-01-16 DIAGNOSIS — R42 Dizziness and giddiness: Secondary | ICD-10-CM | POA: Diagnosis not present

## 2019-01-16 DIAGNOSIS — M6281 Muscle weakness (generalized): Secondary | ICD-10-CM | POA: Diagnosis not present

## 2019-01-16 DIAGNOSIS — M542 Cervicalgia: Secondary | ICD-10-CM | POA: Diagnosis not present

## 2019-01-16 DIAGNOSIS — M25511 Pain in right shoulder: Secondary | ICD-10-CM | POA: Diagnosis not present

## 2019-01-19 DIAGNOSIS — R42 Dizziness and giddiness: Secondary | ICD-10-CM | POA: Diagnosis not present

## 2019-01-19 DIAGNOSIS — M542 Cervicalgia: Secondary | ICD-10-CM | POA: Diagnosis not present

## 2019-01-19 DIAGNOSIS — M6281 Muscle weakness (generalized): Secondary | ICD-10-CM | POA: Diagnosis not present

## 2019-01-19 DIAGNOSIS — M62838 Other muscle spasm: Secondary | ICD-10-CM | POA: Diagnosis not present

## 2019-01-19 DIAGNOSIS — H8111 Benign paroxysmal vertigo, right ear: Secondary | ICD-10-CM | POA: Diagnosis not present

## 2019-02-03 DIAGNOSIS — M25511 Pain in right shoulder: Secondary | ICD-10-CM | POA: Diagnosis not present

## 2019-02-03 DIAGNOSIS — M542 Cervicalgia: Secondary | ICD-10-CM | POA: Diagnosis not present

## 2019-02-03 DIAGNOSIS — M6281 Muscle weakness (generalized): Secondary | ICD-10-CM | POA: Diagnosis not present

## 2019-02-03 DIAGNOSIS — H8111 Benign paroxysmal vertigo, right ear: Secondary | ICD-10-CM | POA: Diagnosis not present

## 2019-02-03 DIAGNOSIS — M62838 Other muscle spasm: Secondary | ICD-10-CM | POA: Diagnosis not present

## 2019-02-05 DIAGNOSIS — M62838 Other muscle spasm: Secondary | ICD-10-CM | POA: Diagnosis not present

## 2019-02-05 DIAGNOSIS — M542 Cervicalgia: Secondary | ICD-10-CM | POA: Diagnosis not present

## 2019-02-05 DIAGNOSIS — M6281 Muscle weakness (generalized): Secondary | ICD-10-CM | POA: Diagnosis not present

## 2019-02-05 DIAGNOSIS — H8111 Benign paroxysmal vertigo, right ear: Secondary | ICD-10-CM | POA: Diagnosis not present

## 2019-02-18 ENCOUNTER — Other Ambulatory Visit: Payer: Self-pay | Admitting: Adult Health

## 2019-02-18 DIAGNOSIS — G250 Essential tremor: Secondary | ICD-10-CM

## 2019-02-20 ENCOUNTER — Other Ambulatory Visit: Payer: Self-pay | Admitting: Adult Health

## 2019-02-20 DIAGNOSIS — G250 Essential tremor: Secondary | ICD-10-CM

## 2019-02-24 DIAGNOSIS — M542 Cervicalgia: Secondary | ICD-10-CM | POA: Diagnosis not present

## 2019-02-24 DIAGNOSIS — M6281 Muscle weakness (generalized): Secondary | ICD-10-CM | POA: Diagnosis not present

## 2019-02-24 DIAGNOSIS — R42 Dizziness and giddiness: Secondary | ICD-10-CM | POA: Diagnosis not present

## 2019-02-24 DIAGNOSIS — M62838 Other muscle spasm: Secondary | ICD-10-CM | POA: Diagnosis not present

## 2019-03-10 DIAGNOSIS — N8110 Cystocele, unspecified: Secondary | ICD-10-CM | POA: Diagnosis not present

## 2019-03-10 DIAGNOSIS — N3281 Overactive bladder: Secondary | ICD-10-CM | POA: Diagnosis not present

## 2019-04-02 DIAGNOSIS — L820 Inflamed seborrheic keratosis: Secondary | ICD-10-CM | POA: Diagnosis not present

## 2019-04-02 DIAGNOSIS — D225 Melanocytic nevi of trunk: Secondary | ICD-10-CM | POA: Diagnosis not present

## 2019-04-02 DIAGNOSIS — D485 Neoplasm of uncertain behavior of skin: Secondary | ICD-10-CM | POA: Diagnosis not present

## 2019-04-02 DIAGNOSIS — L821 Other seborrheic keratosis: Secondary | ICD-10-CM | POA: Diagnosis not present

## 2019-04-02 DIAGNOSIS — L430 Hypertrophic lichen planus: Secondary | ICD-10-CM | POA: Diagnosis not present

## 2019-04-02 DIAGNOSIS — D1801 Hemangioma of skin and subcutaneous tissue: Secondary | ICD-10-CM | POA: Diagnosis not present

## 2019-04-02 DIAGNOSIS — L814 Other melanin hyperpigmentation: Secondary | ICD-10-CM | POA: Diagnosis not present

## 2019-04-27 DIAGNOSIS — E7800 Pure hypercholesterolemia, unspecified: Secondary | ICD-10-CM | POA: Diagnosis not present

## 2019-04-27 DIAGNOSIS — M25562 Pain in left knee: Secondary | ICD-10-CM | POA: Diagnosis not present

## 2019-04-27 DIAGNOSIS — E119 Type 2 diabetes mellitus without complications: Secondary | ICD-10-CM | POA: Diagnosis not present

## 2019-05-01 DIAGNOSIS — E119 Type 2 diabetes mellitus without complications: Secondary | ICD-10-CM | POA: Diagnosis not present

## 2019-05-01 DIAGNOSIS — Z23 Encounter for immunization: Secondary | ICD-10-CM | POA: Diagnosis not present

## 2019-05-01 DIAGNOSIS — E7800 Pure hypercholesterolemia, unspecified: Secondary | ICD-10-CM | POA: Diagnosis not present

## 2019-05-01 DIAGNOSIS — I10 Essential (primary) hypertension: Secondary | ICD-10-CM | POA: Diagnosis not present

## 2019-05-01 DIAGNOSIS — K219 Gastro-esophageal reflux disease without esophagitis: Secondary | ICD-10-CM | POA: Diagnosis not present

## 2019-05-07 ENCOUNTER — Other Ambulatory Visit: Payer: Self-pay | Admitting: Adult Health

## 2019-05-07 DIAGNOSIS — G250 Essential tremor: Secondary | ICD-10-CM

## 2019-05-11 DIAGNOSIS — M1712 Unilateral primary osteoarthritis, left knee: Secondary | ICD-10-CM | POA: Diagnosis not present

## 2019-05-18 ENCOUNTER — Other Ambulatory Visit: Payer: Self-pay

## 2019-05-18 DIAGNOSIS — M1712 Unilateral primary osteoarthritis, left knee: Secondary | ICD-10-CM | POA: Diagnosis not present

## 2019-05-18 DIAGNOSIS — Z20822 Contact with and (suspected) exposure to covid-19: Secondary | ICD-10-CM

## 2019-05-19 ENCOUNTER — Ambulatory Visit: Payer: Self-pay | Admitting: Adult Health

## 2019-05-20 LAB — NOVEL CORONAVIRUS, NAA: SARS-CoV-2, NAA: NOT DETECTED

## 2019-06-01 DIAGNOSIS — M1712 Unilateral primary osteoarthritis, left knee: Secondary | ICD-10-CM | POA: Diagnosis not present

## 2019-06-30 ENCOUNTER — Other Ambulatory Visit: Payer: Self-pay | Admitting: Adult Health

## 2019-06-30 DIAGNOSIS — G250 Essential tremor: Secondary | ICD-10-CM

## 2019-07-22 ENCOUNTER — Ambulatory Visit: Payer: Medicare Other | Attending: Internal Medicine

## 2019-07-22 DIAGNOSIS — Z23 Encounter for immunization: Secondary | ICD-10-CM | POA: Insufficient documentation

## 2019-07-22 NOTE — Progress Notes (Signed)
   Covid-19 Vaccination Clinic  Name:  Erica [name]    MRN: [id number] DOB: 30-May-1932  07/22/2019  Erica [name] was observed post Covid-19 immunization for 15 minutes without incidence. She was provided with Vaccine Information Sheet and instruction to access the V-Safe system.   Erica [name] was instructed to call 911 with any severe reactions post vaccine: Marland Kitchen Difficulty breathing  . Swelling of your face and throat  . A fast heartbeat  . A bad rash all over your body  . Dizziness and weakness    Immunizations Administered    Name Date Dose VIS Date Route   Pfizer COVID-19 Vaccine 07/22/2019  9:31 AM 0.3 mL 06/12/2019 Intramuscular   Manufacturer: [employer], [employer]   Lot: [id number]   [place]: [id number]

## 2019-08-03 ENCOUNTER — Other Ambulatory Visit: Payer: Self-pay | Admitting: Adult Health

## 2019-08-03 DIAGNOSIS — G250 Essential tremor: Secondary | ICD-10-CM

## 2019-08-10 ENCOUNTER — Ambulatory Visit: Payer: Medicare Other | Attending: Internal Medicine

## 2019-08-10 DIAGNOSIS — Z23 Encounter for immunization: Secondary | ICD-10-CM

## 2019-08-10 NOTE — Progress Notes (Signed)
   Covid-19 Vaccination Clinic  Name:  DESARIE MAZMANIAN    MRN: [id number] DOB: [birth year]  08/10/2019  Ms. Heine was observed post Covid-19 immunization for 15 minutes without incidence. She was provided with Vaccine Information Sheet and instruction to access the V-Safe system.   Ms. Fedrick was instructed to call 911 with any severe reactions post vaccine: Marland Kitchen Difficulty breathing  . Swelling of your face and throat  . A fast heartbeat  . A bad rash all over your body  . Dizziness and weakness    Immunizations Administered    Name Date Dose VIS Date Route   Pfizer COVID-19 Vaccine 08/10/2019 12:12 PM 0.3 mL 06/12/2019 Intramuscular   Manufacturer: [place]   Lot: [id number]   [place]: [id number]

## 2019-08-17 ENCOUNTER — Ambulatory Visit: Payer: Medicare Other | Admitting: Adult Health

## 2019-08-17 ENCOUNTER — Encounter: Payer: Self-pay | Admitting: Adult Health

## 2019-08-17 ENCOUNTER — Other Ambulatory Visit: Payer: Self-pay

## 2019-08-17 DIAGNOSIS — G250 Essential tremor: Secondary | ICD-10-CM | POA: Diagnosis not present

## 2019-08-17 MED ORDER — PRIMIDONE 50 MG PO TABS: ORAL_TABLET | ORAL | 3 refills | Status: DC

## 2019-08-17 NOTE — Patient Instructions (Addendum)
Continue using CPAP nightly and greater than 4 hours each night Continue primidone and propranolol for tremor If your symptoms worsen or you develop new symptoms please let us know.   Primidone tablets What is this medicine? PRIMIDONE (PRI mi done) is a barbiturate. This medicine is used to control seizures in certain types of epilepsy. It is not for use in absence (petit mal) seizures. This medicine may be used for other purposes; ask your health care provider or pharmacist if you have questions. COMMON BRAND NAME(S): Mysoline What should I tell my health care provider before I take this medicine? They need to know if you have any of these conditions:  kidney disease  liver disease  porphyria  suicidal thoughts, plans, or attempt; a previous suicide attempt by you or a family member  an unusual or allergic reaction to primidone, phenobarbital, other barbiturates or seizure medications, other medicines, foods, dyes, or preservatives  pregnant or trying to get pregnant  breast-feeding How should I use this medicine? Take this medicine by mouth with a glass of water. Follow the directions on the prescription label. Take your doses at regular intervals. Do not take your medicine more often than directed. Do not stop taking except on the advice of your doctor or health care professional. A special MedGuide will be given to you by the pharmacist with each prescription and refill. Be sure to read this information carefully each time. Contact your pediatrician or health care professional regarding the use of this medication in children. Special care may be needed. While this drug may be prescribed for children for selected conditions, precautions do apply. Overdosage: If you think you have taken too much of this medicine contact a poison control center or emergency room at once. NOTE: This medicine is only for you. Do not share this medicine with others. What if I miss a dose? If you miss a  dose, take it as soon as you can. If it is almost time for your next dose, take only that dose. Do not take double or extra doses. What may interact with this medicine? Do not take this medicine with any of the following medications:  voriconazole This medicine may also interact with the following medications:  cancer-treating medications  cyclosporine  disopyramide  doxycycline  female hormones, including contraceptive or birth control pills  medicines for mental depression, anxiety or other mood problems  medicines for treating HIV infection or AIDS  modafinil  prescription pain medications  quinidine  warfarin This list may not describe all possible interactions. Give your health care provider a list of all the medicines, herbs, non-prescription drugs, or dietary supplements you use. Also tell them if you smoke, drink alcohol, or use illegal drugs. Some items may interact with your medicine. What should I watch for while using this medicine? Visit your doctor or health care professional for regular checks on your progress. It may be 2 to 3 weeks before you see the full effects of this medicine. Do not suddenly stop taking this medicine, you may increase the risk of seizures. Your doctor or health care professional may want to gradually reduce the dose. Wear a medical identification bracelet or chain to say you have epilepsy, and carry a card that lists all your medications. You may get drowsy or dizzy. Do not drive, use machinery, or do anything that needs mental alertness until you know how this medicine affects you. Do not stand or sit up quickly, especially if you are an older patient.  This reduces the risk of dizzy or fainting spells. Alcohol may interfere with the effect of this medicine. Avoid alcoholic drinks. Birth control pills may not work properly while you are taking this medicine. Talk to your doctor about using an extra method of birth control. The use of this  medicine may increase the chance of suicidal thoughts or actions. Pay special attention to how you are responding while on this medicine. Any worsening of mood, or thoughts of suicide or dying should be reported to your health care professional right away. Women who become pregnant while using this medicine may enroll in the [place] Pregnancy Registry by calling [phone]. This registry collects information about the safety of antiepileptic drug use during pregnancy. This medicine may cause a decrease in vitamin D and folic acid. You should make sure that you get enough vitamins while you are taking this medicine. Discuss the foods you eat and the vitamins you take with your health care professional. What side effects may I notice from receiving this medicine? Side effects that you should report to your doctor or health care professional as soon as possible:  allergic reactions like skin rash, itching or hives, swelling of the face, lips, or tongue  blurred, double vision, or uncontrollable rolling or movements of the eyes  redness, blistering, peeling or loosening of the skin, including inside the mouth  shortness of breath or difficulty breathing  unusual excitement or restlessness, more likely in children and the elderly  unusually weak or tired  worsening of mood, thoughts or actions of suicide or dying Side effects that usually do not require medical attention (report to your doctor or health care professional if they continue or are bothersome):  clumsiness, unsteadiness, or a hang-over effect  decreased sexual ability  dizziness, drowsiness  loss of appetite  nausea or vomiting This list may not describe all possible side effects. Call your doctor for medical advice about side effects. You may report side effects to FDA at 1-800-FDA-1088. Where should I keep my medicine? Keep out of the reach of children. This medicine may cause accidental  overdose and death if it taken by other adults, children, or pets. Mix any unused medicine with a substance like cat litter or coffee grounds. Then throw the medicine away in a sealed container like a sealed bag or a coffee can with a lid. Do not use the medicine after the expiration date. Store at room temperature between 15 and 30 degrees C (59 and 86 degrees F). NOTE: This sheet is a summary. It may not cover all possible information. If you have questions about this medicine, talk to your doctor, pharmacist, or health care provider.  2020 Elsevier/Gold Standard (2017-01-30 15:21:47)

## 2019-08-17 NOTE — Progress Notes (Signed)
PATIENT: Erica [name] DOB: 1932-04-03  REASON FOR VISIT: follow up HISTORY FROM: patient  HISTORY OF PRESENT ILLNESS: Today 08/17/19:  Erica [name] is an 86 year old female with a history of obstructive sleep apnea on CPAP and essential tremor.  She returns today for follow-up.  Her download indicates that she use her machine nightly for compliance of 100%.  She use her machine greater than 4 hours each night.  On average she uses her machine 8 hours and 45 minutes.  Her residual AHI is 2.7 on 9 cm of water with EPR 3.  Leak in the 95th percentile is 21.1 L/min.  She continues on propranolol and primidone for essential tremor.  The tremor primarily affects her hands and neck.  She feels that he is no better.  She is most disappointed in her handwriting.  She denies any significant trouble eating although she states that she does adjust depending on which works better for her spine.  She also has some concerns about her memory.  She states that she sometimes forgets people's names although these are people that she has not seen in quite some time.  She denies any trouble completing ADLs.  She operates a Teacher, music.  Denies any trouble managing her finances.  She returns today for an evaluation.  HISTORY 11/04/18:  Erica [name] is an 86 year old female with a history of obstructive sleep apnea on CPAP and essential tremor.  She is being seen today for a virtual visit.  Her download indicates that she use her machine 30 out of 30 days for compliance of 100%.  She used her machine greater than 4 hours each night.  On average she uses her machine 9 hours and 25 minutes.  Her residual AHI is 3.4 on 9 cm of water.  She does have a leak in the 95th percentile at 27.6 L/min.  She states that she just received new supplies but has not changed them out yet.  She feels that her essential tremor has remained stable.  The tremor primarily affects her hands and her neck.  She states that she has been using  CBD oil and thinks that it is offer her some benefit.  She continues on propranolol and primidone.   REVIEW OF SYSTEMS: Out of a complete 14 system review of symptoms, the patient complains only of the following symptoms, and all other reviewed systems are negative.  Epworth sleepiness score 3   ALLERGIES: No Known Allergies  HOME MEDICATIONS: Outpatient Medications Prior to Visit  Medication Sig Dispense Refill  . budesonide-formoterol (SYMBICORT) 160-4.5 MCG/ACT inhaler Inhale 2 puffs into the lungs 2 (two) times daily as needed.     . cholecalciferol (VITAMIN D) 1000 UNITS tablet Take by mouth daily.    Marland Kitchen co-enzyme Q-10 30 MG capsule Take 30 mg by mouth daily.    . metFORMIN (GLUCOPHAGE) 500 MG tablet 250 mg.     . methazolamide (NEPTAZANE) 50 MG tablet Take 1 tablet (50 mg total) by mouth daily. 90 tablet 3  . Multiple Vitamins-Minerals (PRESERVISION AREDS 2 PO) Take by mouth.    Marland Kitchen omeprazole (PRILOSEC) 10 MG capsule Take 10 mg by mouth daily.    . primidone (MYSOLINE) 50 MG tablet TAKE ONE TABLET AT BEDTIME. 90 tablet 0  . propranolol (INDERAL) 10 MG tablet TAKE 1 TABLET UP TO 3 TIMES DAILY AS NEEDED FOR TREMOR CONTROL. (IN ADDITON TO 60MG LA) 270 tablet 0  . propranolol ER (INDERAL LA) 60 MG 24 hr  capsule TAKE 1 CAPSULE DAILY. 90 capsule 0  . sertraline (ZOLOFT) 50 MG tablet Take 50 mg by mouth daily.    . simvastatin (ZOCOR) 10 MG tablet 10 mg.      No facility-administered medications prior to visit.    PAST MEDICAL HISTORY: Past Medical History:  Diagnosis Date  . Cough   . Depression   . Meralgia paresthetica   . Other and unspecified hyperlipidemia   . Right knee pain   . Tremor     PAST SURGICAL HISTORY: Past Surgical History:  Procedure Laterality Date  . APPENDECTOMY    . LUNG BIOPSY  2008  . TONSILLECTOMY AND ADENOIDECTOMY      FAMILY HISTORY: Family History  Problem Relation Age of Onset  . Pancreatitis Mother     SOCIAL HISTORY: Social History    Socioeconomic History  . Marital status: Widowed    Spouse name: Not on file  . Number of children: 3  . Years of education: college  . Highest education level: Not on file  Occupational History  . Occupation: Architectural technologist: [employer]  Tobacco Use  . Smoking status: Never Smoker  . Smokeless tobacco: Never Used  Substance and Sexual Activity  . Alcohol use: No  . Drug use: No  . Sexual activity: Not on file  Other Topics Concern  . Not on file  Social History Narrative   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 Patient lives alone and she is widowed. Patient husband died from a vehicle  accident.    Right handed.   Social Determinants of Health   Financial Resource Strain:   . Difficulty of Paying Living Expenses: Not on file  Food Insecurity:   . Worried About Charity fundraiser in the Last Year: Not on file  . Ran Out of Food in the Last Year: Not on file  Transportation Needs:   . Lack of Transportation (Medical): Not on file  . Lack of Transportation (Non-Medical): Not on file  Physical Activity:   . Days of Exercise per Week: Not on file  . Minutes of Exercise per Session: Not on file  Stress:   . Feeling of Stress : Not on file  Social Connections:   . Frequency of Communication with Friends and Family: Not on file  . Frequency of Social Gatherings with Friends and Family: Not on file  . Attends Religious Services: Not on file  . Active Member of Clubs or Organizations: Not on file  . Attends Archivist Meetings: Not on file  . Marital Status: Not on file  Intimate Partner Violence:   . Fear of Current or Ex-Partner: Not on file  . Emotionally Abused: Not on file  . Physically Abused: Not on file  . Sexually Abused: Not on file      PHYSICAL EXAM  Vitals:   08/17/19 1453  BP: (!) 155/92  Pulse: 64  Temp: (!) 97.5 F (36.4 C)  Weight: 192 lb 3.2 oz (87.2 kg)  Height: 5' 2" (1.575 m)   Body  mass index is 35.15 kg/m.  Generalized: Well developed, in no acute distress   Neurological examination  Mentation: Alert oriented to time, place, history taking. Follows all commands speech and language fluent Cranial nerve II-XII: Pupils were equal round reactive to light. Extraocular movements were full, visual field were full on confrontational test. Head turning and shoulder shrug  were normal and symmetric. Motor: The  motor testing reveals 5 over 5 strength of all 4 extremities. Good symmetric motor tone is noted throughout.  Sensory: Sensory testing is intact to soft touch on all 4 extremities. No evidence of extinction is noted.  Coordination: Cerebellar testing reveals good finger-nose-finger and heel-to-shin bilaterally.  Gait and station: Gait is normal.  Reflexes: Deep tendon reflexes are symmetric and normal bilaterally.   DIAGNOSTIC DATA (LABS, IMAGING, TESTING) - I reviewed patient records, labs, notes, testing and imaging myself where available.  Lab Results  Component Value Date   WBC 10.3 07/09/2008   HGB 14.3 07/09/2008   HCT 42.3 07/09/2008   MCV 90.3 07/09/2008   PLT 300 07/09/2008      Component Value Date/Time   NA 141 07/09/2008 2222   K 4.1 07/09/2008 2222   CL 108 07/09/2008 2222   CO2 22 07/09/2008 2222   GLUCOSE 129 (H) 07/09/2008 2222   BUN 8 07/09/2008 2222   CREATININE 0.73 07/09/2008 2222   CALCIUM 9.0 07/09/2008 2222   PROT 6.1 07/09/2008 2222   ALBUMIN 3.4 (L) 07/09/2008 2222   AST 19 07/09/2008 2222   ALT 19 07/09/2008 2222   ALKPHOS 67 07/09/2008 2222   BILITOT 1.1 07/09/2008 2222   GFRNONAA >60 07/09/2008 2222   GFRAA  07/09/2008 2222    >60        The eGFR has been calculated using the MDRD equation. This calculation has not been validated in all clinical situations. eGFR's persistently <60 mL/min signify possible Chronic Kidney Disease.      ASSESSMENT AND PLAN 86 y.o. year old female  has a past medical history of Cough,  Depression, Meralgia paresthetica, Other and unspecified hyperlipidemia, Right knee pain, and Tremor. here with:  1.  Obstructive sleep apnea on CPAP  -Continue CPAP therapy -Excellent compliance and good treatment of apnea  2.  Essential tremor  -Continue propranolol  -Increase primidone to 25 mg in the morning and 50 mg in the evening -Provided patient with a handout reviewing potential side effects  3.  Memory disturbance  -MMSE 29/30 -Continue to monitor  Advised if symptoms worsen or you develop new symptoms please let us know.  Follow-up in 6 months or sooner if needed or sooner if needed      Ward Givens, MSN, NP-C 08/17/2019, 2:49 PM [facility] [address], [place], [place] [address] [phone]

## 2019-08-25 ENCOUNTER — Ambulatory Visit: Payer: Medicare Other | Admitting: Adult Health

## 2019-10-19 ENCOUNTER — Ambulatory Visit: Payer: Medicare Other | Admitting: Adult Health

## 2019-11-30 ENCOUNTER — Other Ambulatory Visit: Payer: Self-pay | Admitting: Adult Health

## 2019-11-30 DIAGNOSIS — G250 Essential tremor: Secondary | ICD-10-CM

## 2020-02-14 ENCOUNTER — Encounter: Payer: Self-pay | Admitting: Neurology

## 2020-02-15 ENCOUNTER — Encounter: Payer: Self-pay | Admitting: Neurology

## 2020-02-16 ENCOUNTER — Encounter: Payer: Self-pay | Admitting: Adult Health

## 2020-02-16 ENCOUNTER — Ambulatory Visit: Payer: Medicare Other | Admitting: Adult Health

## 2020-02-16 VITALS — BP 134/77 | HR 55 | Ht 62.0 in | Wt 190.0 lb

## 2020-02-16 DIAGNOSIS — G250 Essential tremor: Secondary | ICD-10-CM | POA: Diagnosis not present

## 2020-02-16 DIAGNOSIS — G4733 Obstructive sleep apnea (adult) (pediatric): Secondary | ICD-10-CM

## 2020-02-16 DIAGNOSIS — R42 Dizziness and giddiness: Secondary | ICD-10-CM | POA: Diagnosis not present

## 2020-02-16 DIAGNOSIS — Z9989 Dependence on other enabling machines and devices: Secondary | ICD-10-CM

## 2020-02-16 NOTE — Patient Instructions (Addendum)
Your Plan:  Continue primidone- increase dose to 1/2 tablet in the AM, 1 tablet at night Continue CPAP Continue propranolol Refer to neuro rehab for vertigo If your symptoms worsen or you develop new symptoms please let us know.       Thank you for coming to see [place] at [facility]. I hope we have been able to provide you high quality care today.  You may receive a patient satisfaction survey over the next few weeks. We would appreciate your feedback and comments so that we may continue to improve ourselves and the health of our patients.

## 2020-02-16 NOTE — Progress Notes (Signed)
Erica [name]: Erica [name] DOB: [birth year]  REASON FOR VISIT: follow up HISTORY FROM: Erica [name]  HISTORY OF PRESENT ILLNESS: Today 02/16/20:  Erica [name] is an 86 year old female with a history of obstructive sleep apnea on CPAP.  Her download indicates that she use her machine nightly for compliance of 100%.  She use her machine greater than 4 hours 28 days for compliance of 93%.  On average she uses her machine 7 hours and 57 minutes.  Her residual AHI is 2.2 on 9 cm of water with EPR 3.  Leak in the 95th percentile is 20.1 L/min.  She reports that she continues to have tremor in the hands, neck and voice.  She did not increase the primidone at the last visit.  She has remained on propranolol.  She takes the long-acting dose daily and if her tremors worsen she may take the 10 mg immediate release during the day.    She reports that in the past she has had episodes of vertigo that was resolved with going to physical therapy to have the Epley maneuver completed.  She states for the last 3 to 4 days she feels like she is on the verge of a vertigo episode.  States that when she lays down in bed she will feel the room spinning but as long as she stays still the dizziness resolves.  She would like a referral to a physical therapist.  HISTORY Today 08/17/19:  Erica [name] is an 86 year old female with a history of obstructive sleep apnea on CPAP and essential tremor.  She returns today for follow-up.  Her download indicates that she use her machine nightly for compliance of 100%.  She use her machine greater than 4 hours each night.  On average she uses her machine 8 hours and 45 minutes.  Her residual AHI is 2.7 on 9 cm of water with EPR 3.  Leak in the 95th percentile is 21.1 L/min.  She continues on propranolol and primidone for essential tremor.  The tremor primarily affects her hands and neck.  She feels that he is no better.  She is most disappointed in her handwriting.  She denies any significant  trouble eating although she states that she does adjust depending on which works better for her spine.  She also has some concerns about her memory.  She states that she sometimes forgets people's names although these are people that she has not seen in quite some time.  She denies any trouble completing ADLs.  She operates a Teacher, music.  Denies any trouble managing her finances.  She returns today for an evaluation.   REVIEW OF SYSTEMS: Out of a complete 14 system review of symptoms, the Erica [name] complains only of the following symptoms, and all other reviewed systems are negative.   ESS 1  ALLERGIES: No Known Allergies  HOME MEDICATIONS: Outpatient Medications Prior to Visit  Medication Sig Dispense Refill  . cholecalciferol (VITAMIN D) 1000 UNITS tablet Take by mouth daily.    Marland Kitchen co-enzyme Q-10 30 MG capsule Take 30 mg by mouth daily.    . metFORMIN (GLUCOPHAGE) 500 MG tablet 250 mg.     . methazolamide (NEPTAZANE) 50 MG tablet Take 1 tablet (50 mg total) by mouth daily. 90 tablet 3  . Multiple Vitamins-Minerals (PRESERVISION AREDS 2 PO) Take by mouth.    Marland Kitchen omeprazole (PRILOSEC) 10 MG capsule Take 10 mg by mouth daily.    . primidone (MYSOLINE) 50 MG tablet Take 1/2 tablet in  the AM and 1 tablet in the PM 135 tablet 3  . propranolol (INDERAL) 10 MG tablet TAKE 1 TABLET UP TO 3 TIMES DAILY AS NEEDED FOR TREMOR CONTROL. (IN ADDITON TO 60MG LA) 270 tablet 0  . propranolol ER (INDERAL LA) 60 MG 24 hr capsule TAKE 1 CAPSULE DAILY. 90 capsule 2  . sertraline (ZOLOFT) 50 MG tablet Take 50 mg by mouth daily.    . simvastatin (ZOCOR) 10 MG tablet 10 mg.      No facility-administered medications prior to visit.    PAST MEDICAL HISTORY: Past Medical History:  Diagnosis Date  . Cough   . Depression   . Meralgia paresthetica   . Other and unspecified hyperlipidemia   . Right knee pain   . Tremor     PAST SURGICAL HISTORY: Past Surgical History:  Procedure Laterality Date  .  APPENDECTOMY    . LUNG BIOPSY  2008  . TONSILLECTOMY AND ADENOIDECTOMY      FAMILY HISTORY: Family History  Problem Relation Age of Onset  . Pancreatitis Mother     SOCIAL HISTORY: Social History   Socioeconomic History  . Marital status: Widowed    Spouse name: Not on file  . Number of children: 3  . Years of education: college  . Highest education level: Not on file  Occupational History  . Occupation: Architectural technologist: [employer]  Tobacco Use  . Smoking status: Never Smoker  . Smokeless tobacco: Never Used  Substance and Sexual Activity  . Alcohol use: No  . Drug use: No  . Sexual activity: Not on file  Other Topics Concern  . 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 Erica [name] worked for [employer]. Erica [name] lives alone and she is widowed. Erica [name] husband died from a vehicle  accident.    Right handed.   Social Determinants of Health   Financial Resource Strain:   . Difficulty of Paying Living Expenses:   Food Insecurity:   . Worried About Charity fundraiser in the Last Year:   . Arboriculturist in the Last Year:   Transportation Needs:   . Film/video editor (Medical):   Marland Kitchen Lack of Transportation (Non-Medical):   Physical Activity:   . Days of Exercise per Week:   . Minutes of Exercise per Session:   Stress:   . Feeling of Stress :   Social Connections:   . Frequency of Communication with Friends and Family:   . Frequency of Social Gatherings with Friends and Family:   . Attends Religious Services:   . Active Member of Clubs or Organizations:   . Attends Archivist Meetings:   Marland Kitchen Marital Status:   Intimate Partner Violence:   . Fear of Current or Ex-Partner:   . Emotionally Abused:   Marland Kitchen Physically Abused:   . Sexually Abused:       PHYSICAL EXAM  Vitals:   02/16/20 1124  BP: 134/77  Pulse: (!) 55  Weight: 190 lb (86.2 kg)  Height: '5\' 2"'  (1.575 m)   Body mass index is 34.75  kg/m.  Generalized: Well developed, in no acute distress  Chest: Lungs clear to auscultation bilaterally  Neurological examination  Mentation: Alert oriented to time, place, history taking. Follows all commands speech and language fluent Cranial nerve II-XII: Extraocular movements were full, visual field were full on confrontational test Head turning and shoulder shrug  were normal and  symmetric. Motor: The motor testing reveals 5 over 5 strength of all 4 extremities. Good symmetric motor tone is noted throughout.  Sensory: Sensory testing is intact to soft touch on all 4 extremities. No evidence of extinction is noted.  Gait and station: Gait is normal.    DIAGNOSTIC DATA (LABS, IMAGING, TESTING) - I reviewed Erica [name] records, labs, notes, testing and imaging myself where available.  Lab Results  Component Value Date   WBC 10.3 07/09/2008   HGB 14.3 07/09/2008   HCT 42.3 07/09/2008   MCV 90.3 07/09/2008   PLT 300 07/09/2008      Component Value Date/Time   NA 141 07/09/2008 2222   K 4.1 07/09/2008 2222   CL 108 07/09/2008 2222   CO2 22 07/09/2008 2222   GLUCOSE 129 (H) 07/09/2008 2222   BUN 8 07/09/2008 2222   CREATININE 0.73 07/09/2008 2222   CALCIUM 9.0 07/09/2008 2222   PROT 6.1 07/09/2008 2222   ALBUMIN 3.4 (L) 07/09/2008 2222   AST 19 07/09/2008 2222   ALT 19 07/09/2008 2222   ALKPHOS 67 07/09/2008 2222   BILITOT 1.1 07/09/2008 2222   GFRNONAA >60 07/09/2008 2222   GFRAA  07/09/2008 2222    >60        The eGFR has been calculated using the MDRD equation. This calculation has not been validated in all clinical situations. eGFR's persistently <60 mL/min signify possible Chronic Kidney Disease.      ASSESSMENT AND PLAN 86 y.o. year old female  has a past medical history of Cough, Depression, Meralgia paresthetica, Other and unspecified hyperlipidemia, Right knee pain, and Tremor. here with:  1. OSA on CPAP  - CPAP compliance excellent - Good treatment  of AHI  - Encourage Erica [name] to use CPAP nightly and > 4 hours each night  2. Essential tremor  - increase primidone to 25 mg in the AM, 50 mg in the PM -continue propranolol  3. Vertigo   - refer to PT- neurorehab  FU 6 months      I spent 30 minutes of face-to-face and non-face-to-face time with Erica [name].  This included previsit chart review, lab review, study review, order entry, electronic health record documentation, Erica [name] education.  Ward Givens, MSN, NP-C 02/16/2020, 11:26 AM [facility] [address], [place], [place] [address] [phone]

## 2020-08-14 ENCOUNTER — Other Ambulatory Visit: Payer: Self-pay | Admitting: Adult Health

## 2020-08-14 DIAGNOSIS — G250 Essential tremor: Secondary | ICD-10-CM

## 2020-08-18 ENCOUNTER — Ambulatory Visit: Payer: Medicare Other | Admitting: Adult Health

## 2020-08-18 ENCOUNTER — Encounter: Payer: Self-pay | Admitting: Adult Health

## 2020-08-18 VITALS — BP 138/79 | HR 56 | Ht 62.0 in | Wt 170.0 lb

## 2020-08-18 DIAGNOSIS — G250 Essential tremor: Secondary | ICD-10-CM

## 2020-08-18 DIAGNOSIS — G4733 Obstructive sleep apnea (adult) (pediatric): Secondary | ICD-10-CM | POA: Diagnosis not present

## 2020-08-18 DIAGNOSIS — Z9989 Dependence on other enabling machines and devices: Secondary | ICD-10-CM | POA: Diagnosis not present

## 2020-08-18 MED ORDER — PRIMIDONE 50 MG PO TABS: 50.0000 mg | ORAL_TABLET | Freq: Two times a day (BID) | ORAL | 3 refills | Status: DC

## 2020-08-18 NOTE — Patient Instructions (Signed)
-   increase primidone to 50 mg twice a day -continue propranolol Order sent for new CPAP

## 2020-08-18 NOTE — Progress Notes (Signed)
PATIENT: Erica [name] DOB: [birth year]  REASON FOR VISIT: follow up HISTORY FROM: patient  HISTORY OF PRESENT ILLNESS: Today 08/18/20:  Erica [name] is an 87 year old female with a history of obstructive sleep apnea on CPAP and essential tremor.  She reports that back in September she was traveling in the dog chewed her CPAP machine.  She has not been using the machine since then.  She states that she did contact her DME company but she does not qualify for her machine.  She reports that she increase primidone to 50 mg twice a day.  She states that she does not take 50 mg in the morning consistently but reports that she tolerated it well and did see the benefit.  She has remained on propranolol.  02/16/20: Erica [name] is an 87 year old female with a history of obstructive sleep apnea on CPAP.  Her download indicates that she use her machine nightly for compliance of 100%.  She use her machine greater than 4 hours 28 days for compliance of 93%.  On average she uses her machine 7 hours and 57 minutes.  Her residual AHI is 2.2 on 9 cm of water with EPR 3.  Leak in the 95th percentile is 20.1 L/min.  She reports that she continues to have tremor in the hands, neck and voice.  She did not increase the primidone at the last visit.  She has remained on propranolol.  She takes the long-acting dose daily and if her tremors worsen she may take the 10 mg immediate release during the day.    She reports that in the past she has had episodes of vertigo that was resolved with going to physical therapy to have the Epley maneuver completed.  She states for the last 3 to 4 days she feels like she is on the verge of a vertigo episode.  States that when she lays down in bed she will feel the room spinning but as long as she stays still the dizziness resolves.  She would like a referral to a physical therapist.  HISTORY Today 08/17/19:  Erica [name] is an 87 year old female with a history of obstructive sleep apnea  on CPAP and essential tremor.  She returns today for follow-up.  Her download indicates that she use her machine nightly for compliance of 100%.  She use her machine greater than 4 hours each night.  On average she uses her machine 8 hours and 45 minutes.  Her residual AHI is 2.7 on 9 cm of water with EPR 3.  Leak in the 95th percentile is 21.1 L/min.  She continues on propranolol and primidone for essential tremor.  The tremor primarily affects her hands and neck.  She feels that he is no better.  She is most disappointed in her handwriting.  She denies any significant trouble eating although she states that she does adjust depending on which works better for her spine.  She also has some concerns about her memory.  She states that she sometimes forgets people's names although these are people that she has not seen in quite some time.  She denies any trouble completing ADLs.  She operates a Teacher, music.  Denies any trouble managing her finances.  She returns today for an evaluation.   REVIEW OF SYSTEMS: Out of a complete 14 system review of symptoms, the patient complains only of the following symptoms, and all other reviewed systems are negative.   ESS 1  ALLERGIES: No Known Allergies  HOME  MEDICATIONS: Outpatient Medications Prior to Visit  Medication Sig Dispense Refill  . cholecalciferol (VITAMIN D) 1000 UNITS tablet Take by mouth daily.    Marland Kitchen co-enzyme Q-10 30 MG capsule Take 30 mg by mouth daily.    . metFORMIN (GLUCOPHAGE) 500 MG tablet 250 mg.     . methazolamide (NEPTAZANE) 50 MG tablet Take 1 tablet (50 mg total) by mouth daily. 90 tablet 3  . Multiple Vitamins-Minerals (PRESERVISION AREDS 2 PO) Take by mouth.    Marland Kitchen omeprazole (PRILOSEC) 10 MG capsule Take 10 mg by mouth daily.    . primidone (MYSOLINE) 50 MG tablet Take 1/2 tablet in the AM and 1 tablet in the PM 135 tablet 3  . propranolol (INDERAL) 10 MG tablet TAKE 1 TABLET UP TO 3 TIMES DAILY AS NEEDED FOR TREMOR CONTROL.  (IN ADDITON TO 60MG LA) 270 tablet 0  . propranolol ER (INDERAL LA) 60 MG 24 hr capsule TAKE 1 CAPSULE DAILY. 90 capsule 2  . sertraline (ZOLOFT) 50 MG tablet Take 50 mg by mouth daily.    . simvastatin (ZOCOR) 10 MG tablet 10 mg.      No facility-administered medications prior to visit.    PAST MEDICAL HISTORY: Past Medical History:  Diagnosis Date  . Cough   . Depression   . Meralgia paresthetica   . Other and unspecified hyperlipidemia   . Right knee pain   . Tremor     PAST SURGICAL HISTORY: Past Surgical History:  Procedure Laterality Date  . APPENDECTOMY    . LUNG BIOPSY  2008  . TONSILLECTOMY AND ADENOIDECTOMY      FAMILY HISTORY: Family History  Problem Relation Age of Onset  . Pancreatitis Mother     SOCIAL HISTORY: Social History   Socioeconomic History  . Marital status: Widowed    Spouse name: Not on file  . Number of children: 3  . Years of education: college  . Highest education level: Not on file  Occupational History  . Occupation: Architectural technologist: [employer]  Tobacco Use  . Smoking status: Never Smoker  . Smokeless tobacco: Never Used  Substance and Sexual Activity  . Alcohol use: No  . Drug use: No  . Sexual activity: Not on file  Other Topics Concern  . Not on file  Social History Narrative   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 Patient lives alone and she is widowed. Patient husband died from a vehicle  accident.    Right handed.   Social Determinants of Health   Financial Resource Strain: Not on file  Food Insecurity: Not on file  Transportation Needs: Not on file  Physical Activity: Not on file  Stress: Not on file  Social Connections: Not on file  Intimate Partner Violence: Not on file      PHYSICAL EXAM  Vitals:   08/18/20 0932  BP: 138/79  Pulse: (!) 56  Weight: 170 lb (77.1 kg)  Height: '5\' 2"'  (1.575 m)   Body mass index is 31.09 kg/m.  Generalized: Well developed, in  no acute distress  Chest: Lungs clear to auscultation bilaterally  Neurological examination  Mentation: Alert oriented to time, place, history taking. Follows all commands speech and language fluent Cranial nerve II-XII: Extraocular movements were full, visual field were full on confrontational test Head turning and shoulder shrug  were normal and symmetric. Motor: The motor testing reveals 5 over 5 strength of all 4 extremities. Good symmetric motor  tone is noted throughout.  Sensory: Sensory testing is intact to soft touch on all 4 extremities. No evidence of extinction is noted.  Essential tremor noted in the upper extremities Gait and station: Gait is normal.    DIAGNOSTIC DATA (LABS, IMAGING, TESTING) - I reviewed patient records, labs, notes, testing and imaging myself where available.  Lab Results  Component Value Date   WBC 10.3 07/09/2008   HGB 14.3 07/09/2008   HCT 42.3 07/09/2008   MCV 90.3 07/09/2008   PLT 300 07/09/2008      Component Value Date/Time   NA 141 07/09/2008 2222   K 4.1 07/09/2008 2222   CL 108 07/09/2008 2222   CO2 22 07/09/2008 2222   GLUCOSE 129 (H) 07/09/2008 2222   BUN 8 07/09/2008 2222   CREATININE 0.73 07/09/2008 2222   CALCIUM 9.0 07/09/2008 2222   PROT 6.1 07/09/2008 2222   ALBUMIN 3.4 (L) 07/09/2008 2222   AST 19 07/09/2008 2222   ALT 19 07/09/2008 2222   ALKPHOS 67 07/09/2008 2222   BILITOT 1.1 07/09/2008 2222   GFRNONAA >60 07/09/2008 2222   GFRAA  07/09/2008 2222    >60        The eGFR has been calculated using the MDRD equation. This calculation has not been validated in all clinical situations. eGFR's persistently <60 mL/min signify possible Chronic Kidney Disease.      ASSESSMENT AND PLAN 87 y.o. year old female  has a past medical history of Cough, Depression, Meralgia paresthetica, Other and unspecified hyperlipidemia, Right knee pain, and Tremor. here with:  1. OSA on CPAP  -Order sent for new CPAP machine  2.  Essential tremor  - increase primidone to 50 mg BID -continue propranolol ER 60 mg daily  Advised if symptoms worsen or she develops new symptoms she should let us know FU 6 months      I spent 30 minutes of face-to-face and non-face-to-face time with patient.  This included previsit chart review, lab review, study review, order entry, electronic health record documentation, patient education.  Ward Givens, MSN, NP-C 08/18/2020, 9:31 AM [facility] [address] [place], [place] [address] [phone]

## 2020-08-22 NOTE — Progress Notes (Signed)
Cm sent to [facility]

## 2020-09-15 ENCOUNTER — Other Ambulatory Visit: Payer: Self-pay | Admitting: Adult Health

## 2020-09-15 DIAGNOSIS — G250 Essential tremor: Secondary | ICD-10-CM

## 2020-09-23 ENCOUNTER — Other Ambulatory Visit: Payer: Self-pay | Admitting: Adult Health

## 2020-09-23 DIAGNOSIS — G250 Essential tremor: Secondary | ICD-10-CM

## 2021-01-13 ENCOUNTER — Encounter (INDEPENDENT_AMBULATORY_CARE_PROVIDER_SITE_OTHER): Payer: Medicare Other | Admitting: Ophthalmology

## 2021-01-19 ENCOUNTER — Encounter (INDEPENDENT_AMBULATORY_CARE_PROVIDER_SITE_OTHER): Payer: Medicare Other | Admitting: Ophthalmology

## 2021-01-19 ENCOUNTER — Other Ambulatory Visit: Payer: Self-pay

## 2021-01-19 DIAGNOSIS — H43813 Vitreous degeneration, bilateral: Secondary | ICD-10-CM

## 2021-01-19 DIAGNOSIS — I10 Essential (primary) hypertension: Secondary | ICD-10-CM | POA: Diagnosis not present

## 2021-01-19 DIAGNOSIS — H353211 Exudative age-related macular degeneration, right eye, with active choroidal neovascularization: Secondary | ICD-10-CM

## 2021-01-19 DIAGNOSIS — H35033 Hypertensive retinopathy, bilateral: Secondary | ICD-10-CM | POA: Diagnosis not present

## 2021-01-19 DIAGNOSIS — H353122 Nonexudative age-related macular degeneration, left eye, intermediate dry stage: Secondary | ICD-10-CM

## 2021-02-16 ENCOUNTER — Ambulatory Visit: Payer: Medicare Other | Admitting: Adult Health

## 2021-02-20 ENCOUNTER — Other Ambulatory Visit: Payer: Self-pay

## 2021-02-20 ENCOUNTER — Encounter (INDEPENDENT_AMBULATORY_CARE_PROVIDER_SITE_OTHER): Payer: Medicare Other | Admitting: Ophthalmology

## 2021-02-20 DIAGNOSIS — H35033 Hypertensive retinopathy, bilateral: Secondary | ICD-10-CM

## 2021-02-20 DIAGNOSIS — I10 Essential (primary) hypertension: Secondary | ICD-10-CM | POA: Diagnosis not present

## 2021-02-20 DIAGNOSIS — H353122 Nonexudative age-related macular degeneration, left eye, intermediate dry stage: Secondary | ICD-10-CM | POA: Diagnosis not present

## 2021-02-20 DIAGNOSIS — H353211 Exudative age-related macular degeneration, right eye, with active choroidal neovascularization: Secondary | ICD-10-CM | POA: Diagnosis not present

## 2021-02-20 DIAGNOSIS — H43813 Vitreous degeneration, bilateral: Secondary | ICD-10-CM

## 2021-02-21 ENCOUNTER — Ambulatory Visit: Payer: Medicare Other | Admitting: Adult Health

## 2021-02-21 ENCOUNTER — Encounter: Payer: Self-pay | Admitting: Adult Health

## 2021-02-21 VITALS — BP 122/69 | HR 61 | Ht 63.0 in | Wt 171.4 lb

## 2021-02-21 DIAGNOSIS — Z9989 Dependence on other enabling machines and devices: Secondary | ICD-10-CM | POA: Diagnosis not present

## 2021-02-21 DIAGNOSIS — G4733 Obstructive sleep apnea (adult) (pediatric): Secondary | ICD-10-CM | POA: Diagnosis not present

## 2021-02-21 NOTE — Progress Notes (Signed)
Cpap orders sent to [facility].

## 2021-02-21 NOTE — Progress Notes (Signed)
PATIENT: Erica [name] DOB: 1932-04-06  REASON FOR VISIT: follow up HISTORY FROM: patient  HISTORY OF PRESENT ILLNESS: Today 02/21/21:   Erica [name] is an 87 year old female with a history of obstructive sleep apnea on CPAP and essential tremor.  She returns today for follow-up.  She states that the dog chewed up her CPAP supplies and she is not got new supplies from her DME company.  She states that she has tried to reach out but she may have the wrong number.  Therefore she has not been using her CPAP machine.  She states that her tremors have remained relatively stable.  She states that she often forgets to take primidone in the morning.  She continues on propranolol extended release 60 mg daily in the immediate release 10 mg 3 times a day.  08/18/20: Erica [name] is an 87 year old female with a history of obstructive sleep apnea on CPAP and essential tremor.  She reports that back in September she was traveling in the dog chewed her CPAP machine.  She has not been using the machine since then.  She states that she did contact her DME company but she does not qualify for her machine.  She reports that she increase primidone to 50 mg twice a day.  She states that she does not take 50 mg in the morning consistently but reports that she tolerated it well and did see the benefit.  She has remained on propranolol.  02/16/20: Erica [name] is an 87 year old female with a history of obstructive sleep apnea on CPAP.  Her download indicates that she use her machine nightly for compliance of 100%.  She use her machine greater than 4 hours 28 days for compliance of 93%.  On average she uses her machine 7 hours and 57 minutes.  Her residual AHI is 2.2 on 9 cm of water with EPR 3.  Leak in the 95th percentile is 20.1 L/min.  She reports that she continues to have tremor in the hands, neck and voice.  She did not increase the primidone at the last visit.  She has remained on propranolol.  She takes the  long-acting dose daily and if her tremors worsen she may take the 10 mg immediate release during the day.    She reports that in the past she has had episodes of vertigo that was resolved with going to physical therapy to have the Epley maneuver completed.  She states for the last 3 to 4 days she feels like she is on the verge of a vertigo episode.  States that when she lays down in bed she will feel the room spinning but as long as she stays still the dizziness resolves.  She would like a referral to a physical therapist.  HISTORY Today 08/17/19:   Erica [name] is an 87 year old female with a history of obstructive sleep apnea on CPAP and essential tremor.  She returns today for follow-up.  Her download indicates that she use her machine nightly for compliance of 100%.  She use her machine greater than 4 hours each night.  On average she uses her machine 8 hours and 45 minutes.  Her residual AHI is 2.7 on 9 cm of water with EPR 3.  Leak in the 95th percentile is 21.1 L/min.   She continues on propranolol and primidone for essential tremor.  The tremor primarily affects her hands and neck.  She feels that he is no better.  She is most disappointed in her  handwriting.  She denies any significant trouble eating although she states that she does adjust depending on which works better for her spine.   She also has some concerns about her memory.  She states that she sometimes forgets people's names although these are people that she has not seen in quite some time.  She denies any trouble completing ADLs.  She operates a Teacher, music.  Denies any trouble managing her finances.  She returns today for an evaluation.    REVIEW OF SYSTEMS: Out of a complete 14 system review of symptoms, the patient complains only of the following symptoms, and all other reviewed systems are negative.   ESS 1  ALLERGIES: No Known Allergies  HOME MEDICATIONS: Outpatient Medications Prior to Visit  Medication Sig Dispense  Refill   cholecalciferol (VITAMIN D) 1000 UNITS tablet Take by mouth daily.     co-enzyme Q-10 30 MG capsule Take 30 mg by mouth daily.     metFORMIN (GLUCOPHAGE) 500 MG tablet 250 mg.      methazolamide (NEPTAZANE) 50 MG tablet Take 1 tablet (50 mg total) by mouth daily. 90 tablet 3   Multiple Vitamins-Minerals (PRESERVISION AREDS 2 PO) Take by mouth.     omeprazole (PRILOSEC) 10 MG capsule Take 10 mg by mouth daily.     primidone (MYSOLINE) 50 MG tablet Take 1 tablet (50 mg total) by mouth in the morning and at bedtime. 180 tablet 3   propranolol (INDERAL) 10 MG tablet TAKE 1 TABLET UP TO 3 TIMES DAILY AS NEEDED FOR TREMOR CONTROL. (IN ADDITON TO 60MG LA) 270 tablet 0   propranolol ER (INDERAL LA) 60 MG 24 hr capsule TAKE 1 CAPSULE DAILY. 90 capsule 3   sertraline (ZOLOFT) 50 MG tablet Take 50 mg by mouth daily.     simvastatin (ZOCOR) 10 MG tablet 10 mg.      No facility-administered medications prior to visit.    PAST MEDICAL HISTORY: Past Medical History:  Diagnosis Date   Cough    Depression    Meralgia paresthetica    Other and unspecified hyperlipidemia    Right knee pain    Tremor     PAST SURGICAL HISTORY: Past Surgical History:  Procedure Laterality Date   APPENDECTOMY     LUNG BIOPSY  2008   TONSILLECTOMY AND ADENOIDECTOMY      FAMILY HISTORY: Family History  Problem Relation Age of Onset   Pancreatitis Mother     SOCIAL HISTORY: Social History   Socioeconomic History   Marital status: Widowed    Spouse name: Not on file   Number of children: 3   Years of education: college   Highest education level: Not on file  Occupational History   Occupation: REALTOR    Employer: [employer]  Tobacco Use   Smoking status: Never   Smokeless tobacco: Never  Substance and Sexual Activity   Alcohol use: No   Drug use: No   Sexual activity: Not on file  Other Topics Concern   Not on file  Social History Narrative   Patient is retired Cabin crew for 33  years. Patient worked for [employer]. Patient lives alone and she is widowed. Patient husband died from a vehicle  accident.    Right handed.   Social Determinants of Health   Financial Resource Strain: Not on file  Food Insecurity: Not on file  Transportation Needs: Not on file  Physical Activity: Not on file  Stress: Not on file  Social Connections: Not  on file  Intimate Partner Violence: Not on file      PHYSICAL EXAM  There were no vitals filed for this visit.  There is no height or weight on file to calculate BMI.  Generalized: Well developed, in no acute distress  Chest: Lungs clear to auscultation bilaterally  Neurological examination  Mentation: Alert oriented to time, place, history taking. Follows all commands speech and language fluent Cranial nerve II-XII: Extraocular movements were full, visual field were full on confrontational test Head turning and shoulder shrug  were normal and symmetric. Motor: The motor testing reveals 5 over 5 strength of all 4 extremities. Good symmetric motor tone is noted throughout.  Sensory: Sensory testing is intact to soft touch on all 4 extremities. No evidence of extinction is noted.  Essential tremor noted in the upper extremities Gait and station: Gait is normal.    DIAGNOSTIC DATA (LABS, IMAGING, TESTING) - I reviewed patient records, labs, notes, testing and imaging myself where available.  Lab Results  Component Value Date   WBC 10.3 07/09/2008   HGB 14.3 07/09/2008   HCT 42.3 07/09/2008   MCV 90.3 07/09/2008   PLT 300 07/09/2008      Component Value Date/Time   NA 141 07/09/2008 2222   K 4.1 07/09/2008 2222   CL 108 07/09/2008 2222   CO2 22 07/09/2008 2222   GLUCOSE 129 (H) 07/09/2008 2222   BUN 8 07/09/2008 2222   CREATININE 0.73 07/09/2008 2222   CALCIUM 9.0 07/09/2008 2222   PROT 6.1 07/09/2008 2222   ALBUMIN 3.4 (L) 07/09/2008 2222   AST 19 07/09/2008 2222   ALT 19 07/09/2008 2222   ALKPHOS 67 07/09/2008 2222    BILITOT 1.1 07/09/2008 2222   GFRNONAA >60 07/09/2008 2222   GFRAA  07/09/2008 2222    >60        The eGFR has been calculated using the MDRD equation. This calculation has not been validated in all clinical situations. eGFR's persistently <60 mL/min signify possible Chronic Kidney Disease.      ASSESSMENT AND PLAN 87 y.o. year old female  has a past medical history of Cough, Depression, Meralgia paresthetica, Other and unspecified hyperlipidemia, Right knee pain, and Tremor. here with:  OSA on CPAP  -Order sent for new CPAP supplies.  Advised if she does not hear from her DME company in 24 to 48 hours she can reach out to our office  2. Essential tremor  -Continue primidone to 50 mg BID -continue propranolol ER 60 mg daily -Continue propranolol 10 mg immediate release 3 times daily  Advised if symptoms worsen or she develops new symptoms she should let us know FU 6 months     Ward Givens, MSN, NP-C 02/21/2021, 10:09 AM [facility] [address] [place],  [address] [phone]

## 2021-03-20 ENCOUNTER — Other Ambulatory Visit: Payer: Self-pay

## 2021-03-20 ENCOUNTER — Encounter (INDEPENDENT_AMBULATORY_CARE_PROVIDER_SITE_OTHER): Payer: Medicare Other | Admitting: Ophthalmology

## 2021-03-20 ENCOUNTER — Other Ambulatory Visit: Payer: Self-pay | Admitting: Adult Health

## 2021-03-20 DIAGNOSIS — H35033 Hypertensive retinopathy, bilateral: Secondary | ICD-10-CM

## 2021-03-20 DIAGNOSIS — H353211 Exudative age-related macular degeneration, right eye, with active choroidal neovascularization: Secondary | ICD-10-CM

## 2021-03-20 DIAGNOSIS — G250 Essential tremor: Secondary | ICD-10-CM

## 2021-03-20 DIAGNOSIS — H353122 Nonexudative age-related macular degeneration, left eye, intermediate dry stage: Secondary | ICD-10-CM

## 2021-03-20 DIAGNOSIS — I10 Essential (primary) hypertension: Secondary | ICD-10-CM | POA: Diagnosis not present

## 2021-03-20 DIAGNOSIS — H43813 Vitreous degeneration, bilateral: Secondary | ICD-10-CM

## 2021-04-17 ENCOUNTER — Encounter (INDEPENDENT_AMBULATORY_CARE_PROVIDER_SITE_OTHER): Payer: Medicare Other | Admitting: Ophthalmology

## 2021-04-17 ENCOUNTER — Other Ambulatory Visit: Payer: Self-pay

## 2021-04-17 DIAGNOSIS — H43813 Vitreous degeneration, bilateral: Secondary | ICD-10-CM

## 2021-04-17 DIAGNOSIS — H353122 Nonexudative age-related macular degeneration, left eye, intermediate dry stage: Secondary | ICD-10-CM

## 2021-04-17 DIAGNOSIS — I10 Essential (primary) hypertension: Secondary | ICD-10-CM | POA: Diagnosis not present

## 2021-04-17 DIAGNOSIS — H353211 Exudative age-related macular degeneration, right eye, with active choroidal neovascularization: Secondary | ICD-10-CM

## 2021-04-17 DIAGNOSIS — H35033 Hypertensive retinopathy, bilateral: Secondary | ICD-10-CM

## 2021-05-15 ENCOUNTER — Encounter (INDEPENDENT_AMBULATORY_CARE_PROVIDER_SITE_OTHER): Payer: Medicare Other | Admitting: Ophthalmology

## 2021-05-15 ENCOUNTER — Other Ambulatory Visit: Payer: Self-pay

## 2021-05-15 DIAGNOSIS — I10 Essential (primary) hypertension: Secondary | ICD-10-CM

## 2021-05-15 DIAGNOSIS — H35033 Hypertensive retinopathy, bilateral: Secondary | ICD-10-CM | POA: Diagnosis not present

## 2021-05-15 DIAGNOSIS — H353211 Exudative age-related macular degeneration, right eye, with active choroidal neovascularization: Secondary | ICD-10-CM | POA: Diagnosis not present

## 2021-05-15 DIAGNOSIS — H353122 Nonexudative age-related macular degeneration, left eye, intermediate dry stage: Secondary | ICD-10-CM | POA: Diagnosis not present

## 2021-05-15 DIAGNOSIS — H43813 Vitreous degeneration, bilateral: Secondary | ICD-10-CM

## 2021-06-12 ENCOUNTER — Encounter (INDEPENDENT_AMBULATORY_CARE_PROVIDER_SITE_OTHER): Payer: Medicare Other | Admitting: Ophthalmology

## 2021-06-12 ENCOUNTER — Other Ambulatory Visit: Payer: Self-pay

## 2021-06-12 DIAGNOSIS — H35033 Hypertensive retinopathy, bilateral: Secondary | ICD-10-CM

## 2021-06-12 DIAGNOSIS — H353211 Exudative age-related macular degeneration, right eye, with active choroidal neovascularization: Secondary | ICD-10-CM | POA: Diagnosis not present

## 2021-06-12 DIAGNOSIS — H43813 Vitreous degeneration, bilateral: Secondary | ICD-10-CM

## 2021-06-12 DIAGNOSIS — H353122 Nonexudative age-related macular degeneration, left eye, intermediate dry stage: Secondary | ICD-10-CM | POA: Diagnosis not present

## 2021-06-12 DIAGNOSIS — I10 Essential (primary) hypertension: Secondary | ICD-10-CM

## 2021-07-10 ENCOUNTER — Other Ambulatory Visit: Payer: Self-pay

## 2021-07-10 ENCOUNTER — Encounter (INDEPENDENT_AMBULATORY_CARE_PROVIDER_SITE_OTHER): Payer: Medicare Other | Admitting: Ophthalmology

## 2021-07-10 DIAGNOSIS — H353122 Nonexudative age-related macular degeneration, left eye, intermediate dry stage: Secondary | ICD-10-CM | POA: Diagnosis not present

## 2021-07-10 DIAGNOSIS — H353211 Exudative age-related macular degeneration, right eye, with active choroidal neovascularization: Secondary | ICD-10-CM

## 2021-07-10 DIAGNOSIS — I10 Essential (primary) hypertension: Secondary | ICD-10-CM | POA: Diagnosis not present

## 2021-07-10 DIAGNOSIS — H43813 Vitreous degeneration, bilateral: Secondary | ICD-10-CM

## 2021-07-10 DIAGNOSIS — H35033 Hypertensive retinopathy, bilateral: Secondary | ICD-10-CM

## 2021-07-10 DIAGNOSIS — H2703 Aphakia, bilateral: Secondary | ICD-10-CM

## 2021-08-07 ENCOUNTER — Other Ambulatory Visit: Payer: Self-pay

## 2021-08-07 ENCOUNTER — Encounter (INDEPENDENT_AMBULATORY_CARE_PROVIDER_SITE_OTHER): Payer: Medicare Other | Admitting: Ophthalmology

## 2021-08-07 DIAGNOSIS — H353122 Nonexudative age-related macular degeneration, left eye, intermediate dry stage: Secondary | ICD-10-CM | POA: Diagnosis not present

## 2021-08-07 DIAGNOSIS — H353211 Exudative age-related macular degeneration, right eye, with active choroidal neovascularization: Secondary | ICD-10-CM | POA: Diagnosis not present

## 2021-08-07 DIAGNOSIS — H43813 Vitreous degeneration, bilateral: Secondary | ICD-10-CM

## 2021-08-07 DIAGNOSIS — I10 Essential (primary) hypertension: Secondary | ICD-10-CM

## 2021-08-07 DIAGNOSIS — H35033 Hypertensive retinopathy, bilateral: Secondary | ICD-10-CM | POA: Diagnosis not present

## 2021-08-28 ENCOUNTER — Ambulatory Visit: Payer: Medicare Other | Admitting: Adult Health

## 2021-09-04 ENCOUNTER — Encounter (INDEPENDENT_AMBULATORY_CARE_PROVIDER_SITE_OTHER): Payer: Medicare Other | Admitting: Ophthalmology

## 2021-09-04 ENCOUNTER — Other Ambulatory Visit: Payer: Self-pay

## 2021-09-04 DIAGNOSIS — H353211 Exudative age-related macular degeneration, right eye, with active choroidal neovascularization: Secondary | ICD-10-CM

## 2021-09-04 DIAGNOSIS — H353122 Nonexudative age-related macular degeneration, left eye, intermediate dry stage: Secondary | ICD-10-CM | POA: Diagnosis not present

## 2021-09-04 DIAGNOSIS — H43813 Vitreous degeneration, bilateral: Secondary | ICD-10-CM | POA: Diagnosis not present

## 2021-09-04 DIAGNOSIS — I10 Essential (primary) hypertension: Secondary | ICD-10-CM

## 2021-09-04 DIAGNOSIS — H35033 Hypertensive retinopathy, bilateral: Secondary | ICD-10-CM | POA: Diagnosis not present

## 2021-09-06 ENCOUNTER — Ambulatory Visit (HOSPITAL_COMMUNITY)
Admission: RE | Admit: 2021-09-06 | Discharge: 2021-09-06 | Disposition: A | Discharge status: Home / Self Care | Payer: Medicare Other | Source: Ambulatory Visit | Attending: Cardiovascular Disease | Admitting: Cardiovascular Disease

## 2021-09-06 ENCOUNTER — Other Ambulatory Visit: Payer: Self-pay

## 2021-09-06 ENCOUNTER — Other Ambulatory Visit (HOSPITAL_COMMUNITY): Payer: Self-pay | Admitting: Physician Assistant

## 2021-09-06 DIAGNOSIS — M79662 Pain in left lower leg: Secondary | ICD-10-CM

## 2021-09-06 DIAGNOSIS — M79669 Pain in unspecified lower leg: Secondary | ICD-10-CM

## 2021-09-06 DIAGNOSIS — M7989 Other specified soft tissue disorders: Secondary | ICD-10-CM | POA: Insufficient documentation

## 2021-10-02 ENCOUNTER — Encounter (INDEPENDENT_AMBULATORY_CARE_PROVIDER_SITE_OTHER): Payer: Medicare Other | Admitting: Ophthalmology

## 2021-10-02 DIAGNOSIS — I10 Essential (primary) hypertension: Secondary | ICD-10-CM | POA: Diagnosis not present

## 2021-10-02 DIAGNOSIS — H353122 Nonexudative age-related macular degeneration, left eye, intermediate dry stage: Secondary | ICD-10-CM

## 2021-10-02 DIAGNOSIS — H43813 Vitreous degeneration, bilateral: Secondary | ICD-10-CM

## 2021-10-02 DIAGNOSIS — H35033 Hypertensive retinopathy, bilateral: Secondary | ICD-10-CM | POA: Diagnosis not present

## 2021-10-02 DIAGNOSIS — H353211 Exudative age-related macular degeneration, right eye, with active choroidal neovascularization: Secondary | ICD-10-CM

## 2021-10-05 ENCOUNTER — Encounter: Payer: Self-pay | Admitting: Neurology

## 2021-10-05 ENCOUNTER — Ambulatory Visit: Payer: Medicare Other | Admitting: Neurology

## 2021-10-05 VITALS — BP 143/81 | HR 75 | Ht 61.0 in | Wt 176.8 lb

## 2021-10-05 DIAGNOSIS — Z9989 Dependence on other enabling machines and devices: Secondary | ICD-10-CM | POA: Diagnosis not present

## 2021-10-05 DIAGNOSIS — G4733 Obstructive sleep apnea (adult) (pediatric): Secondary | ICD-10-CM | POA: Diagnosis not present

## 2021-10-05 DIAGNOSIS — G250 Essential tremor: Secondary | ICD-10-CM

## 2021-10-05 MED ORDER — PROPRANOLOL HCL 10 MG PO TABS: 10.0000 mg | ORAL_TABLET | Freq: Three times a day (TID) | ORAL | 1 refills | Status: DC | PRN

## 2021-10-05 MED ORDER — PRIMIDONE 50 MG PO TABS: 50.0000 mg | ORAL_TABLET | Freq: Two times a day (BID) | ORAL | 3 refills | Status: DC

## 2021-10-05 MED ORDER — PROPRANOLOL HCL ER 60 MG PO CP24: 60.0000 mg | ORAL_CAPSULE | Freq: Every day | ORAL | 1 refills | Status: DC

## 2021-10-05 NOTE — Patient Instructions (Signed)
It was nice to see you again today.  ?Please continue using your CPAP regularly. While your insurance requires that you use CPAP at least 4 hours each night on 70% of the nights, I recommend, that you not skip any nights and use it throughout the night if you can. Getting used to CPAP and staying with the treatment long term does take time and patience and discipline. Untreated obstructive sleep apnea when it is moderate to severe can have an adverse impact on cardiovascular health and raise her risk for heart disease, arrhythmias, hypertension, congestive heart failure, stroke and diabetes. Untreated obstructive sleep apnea causes sleep disruption, nonrestorative sleep, and sleep deprivation. This can have an impact on your day to day functioning and cause daytime sleepiness and impairment of cognitive function, memory loss, mood disturbance, and problems focussing. Using CPAP regularly can improve these symptoms. ?We will keep your tremor medication the same.  ? ? ?

## 2021-10-05 NOTE — Progress Notes (Signed)
Subjective:  ?  ?Patient ID: Erica [name] is a 88 y.o. female. ? ?HPI ? ? ? ?Interim history:  ? ?[name] is an 88 year old right-handed woman with an underlying medical history of depression and chronic cough, status post lung biopsy, status post tonsillectomy and appendectomy, who presents for followup consultation of her essential tremor and OSA, on CPAP. The patient is unaccompanied today. I last saw her on 02/04/2017, at which time she was doing well with her CPAP.  She did not take it with her on vacation.  She felt that her tremor has become worse.  She had not been on Mysoline any longer, she was using long-acting propranolol 60 mg once daily.  She was also taking immediate release propranolol once or twice daily as needed.  We mutually agreed to restart her on Mysoline.  She saw Ward Givens, NP on 07/08/2018, at which time she was compliant with her CPAP.  She was stable on primidone and propranolol and felt that her tremor was stable.  She saw Ward Givens, NP in a virtual visit on 11/05/2018 at which time she was compliant with her CPAP and advised to continue with her primidone and propranolol.  She saw Ward Givens on 08/17/2019, at which time she was advised to add a morning dose of primidone.  She was advised to continue with propranolol.  She had some memory concerns, MMSE was 29.  She was advised that we would monitor her memory issues.  She saw Megan on 02/16/2020, at which time she reported that she did not increase the primidone after the last visit.  She felt that she had vertigo symptoms, she has a history of recurrent vertigo and had been to physical therapy in the past, she requested a referral to physical therapy which we provided.  She was advised to increase the primidone as previously discussed.  She saw Megan on 08/18/2020, at which time she was not using her CPAP since September 2021.  She reported that her dog had chewed on her machine.  She had increase the primidone to twice  daily but reported that she did not take the morning dose consistently.  She saw Megan on 02/21/2021, at which time she was not using her CPAP, she had not been in touch with her DME company.  A new order for supplies was sent to her DME company.  ? ?Today, 10/05/21: I reviewed her CPAP compliance data from 09/03/2021 through 10/02/2021, which is a total of 30 days, during which time she used her machine 23 days with percent use days greater than 4 hours at 67%, indicating slightly suboptimal compliance with an average usage of 7 hours and 16 minutes for days on treatment.  Average AHI 2.5/h, leak acceptable with the 95th percentile at 17.9 L/min on a pressure of 9 cm with EPR of 3.  She reports that when her allergies flareup she does not use her machine.  She had a cough and just recently got a steroid shot and is in the process of weaning oral steroids.  She has not used her machine for that reason lately.  When she took a trip to [place] recently she did not take her machine with her.  She does need new supplies and is motivated to continue with treatment.  Tremor flares up when she is nervous or anxious.  She does take her long-acting propranolol and short acting propranolol 3 times a day.  She takes primidone twice daily and has been more consistent  with taking the primidone twice daily.  Overall, she feels quite stable with regards to her sleep apnea is well as her tremor.   ?  ?The patient's allergies, current medications, family history, past medical history, past social history, past surgical history and problem list were reviewed and updated as appropriate.  ?  ?Previously (copied from previous notes for reference):  ? ?I saw her on 08/07/2016, which time she was fully compliant with CPAP and reported doing well. She felt that her sleep quality was better. Tremor was stable. She was on long-acting propranolol 60 mg in the morning as well as 10 mg propranolol immediate release. She reports doing well with  CPAP, sleep is better quality, tremor stable and takes the propranolol LA 60 mg around 9 AM along with a 10 mg IR, typically no more IR for the rest of the day. She had seen Ward Givens in the interim on 04/10/2016 and was advised to try to increase Mysoline to 50 mg at bedtime. She was working on a regular exercise program.  ?  ?  ?I reviewed her CPAP compliance data from 11/02/2016 through 01/30/2017, which is a total of 90 days, during which time she used her machine 67 days with percent used days greater than 4 hours at 71%. In the past 30 days she had a compliance percentage of 73%, average usage of 8 hours and 3 minutes. She had skipped 8 nights out of the past 30 between 01/01/2017 and 01/30/2017. Average AHI borderline at 5.2, leak acceptable with the 95th percentile at 15.1 liters per minute on a pressure of 9 cm with EPR of 3.  ?  ?I saw her on 02/22/2016, at which time she was on propranolol for her tremor but methazolamide was too expensive. I suggested we tapered her off the methazolamide and retry her on low-dose primidone. She also reported a diagnosis of obstructive sleep apnea several years ago and complained of sleepiness during the day, was willing to come back for sleep study for reevaluation. She had a baseline sleep study, followed by a CPAP titration study. I went over her test results with her in detail today. Her baseline sleep study from 03/12/2016 showed a sleep efficiency of 83.3%, arousal index was 20 per hour, increased percentage of stage I sleep, increased percentage of stage II sleep, absence of slow-wave sleep and absence of REM sleep. Total AHI was 21.7 per hour, rising to 56.7 per hour in the supine position. Average oxygen saturation was 90%, nadir was 86%. She had severe PLMS with very mild arousals. She was invited for a full night CPAP titration study. She had this on 03/28/2016. Sleep efficiency was 74.5%, sleep latency 43.5 minutes, she had absence of slow-wave sleep and  REM sleep was 15.4%, increase of stage II sleep. CPAP was titrated from 5 cm to 9 cm. AHI was 2 per hour on the final pressure, O2 nadir 87%, based on her test results are prescribed CPAP therapy for home use.  ?  ?I reviewed her CPAP compliance data from 07/07/2016 through 08/05/2016, which is a total of 30 days, during which time she used her machine every night with percent used days greater than 4 hours at 100%, indicating superb compliance with an average usage of 7 hours and 44 minutes, residual AHI borderline at 6.2 per hour, leak low with the 95th percentile at 7.8 L/m on a pressure of 9 cm with EPR of 3.  ?  ?I first met her on 02/19/2014,  at which time she presented for her yearly checkup on her tremors. She felt that she was doing quite well. She had flareup of tremors when she felt nervous. I suggested she continue with her medications. She was advised to use low-dose propranolol 10 mg strength as needed for flareup of her symptoms. She felt well enough to pursue a once yearly checkup. ?  ?She previously followed with Dr. Morene Antu and was last seen by him on 08/22/2012 at which time he increased her methazolamide to 50 mg daily. He kept her on long-acting propranolol and did not suggest increasing it for fear of exacerbating her cough and her depression. She was seen on 02/19/2013 by Dr. Krista Blue and her Inderal LA prescription was refilled with plan for a followup in one year. I reviewed Dr. Tressia Danas and Dr. Rhea Belton notes. She has had a long-standing history of hand tremor as well as head titubation which started around 2003. There is a family history of tremors in her PGM, father, brother and sister. She started seeing Dr. Erling Cruz in 2008. She has been on propranolol and methazolamide for years. She was tried on primidone but it made her too sleepy even on low-dose. According to the record she has not been on Topamax or gabapentin. For her cough she has been evaluated at [facility] and [facility]. She does not drink  alcohol. She does not smoke. She sees an allergy specialist at [facility]. She feels for the most part that her tremor is stable or only gradually getting worse. She does know her triggers and realizes that

## 2021-10-09 NOTE — Progress Notes (Signed)
Denyse Amass, RN; Vanessa Ralphs ?got it   ? ?  ?Previous Messages ?  ?----- Message -----  ?From: Brandon Melnick, RN  ?Sent: 10/05/2021   1:52 PM EDT  ?To: [name]  ?Subject: renew pap supplies                            ? ?New order in epic for pt  ? ? ?Erica [name]  ?Female, 88 y.o., 1931-10-16  ?MRN:  ?[id number]  ?Thank you  ? ?Lovey Newcomer Rn  ? ?

## 2021-10-30 ENCOUNTER — Encounter (INDEPENDENT_AMBULATORY_CARE_PROVIDER_SITE_OTHER): Payer: Medicare Other | Admitting: Ophthalmology

## 2021-10-30 DIAGNOSIS — H43813 Vitreous degeneration, bilateral: Secondary | ICD-10-CM | POA: Diagnosis not present

## 2021-10-30 DIAGNOSIS — I10 Essential (primary) hypertension: Secondary | ICD-10-CM

## 2021-10-30 DIAGNOSIS — H35033 Hypertensive retinopathy, bilateral: Secondary | ICD-10-CM

## 2021-10-30 DIAGNOSIS — H353211 Exudative age-related macular degeneration, right eye, with active choroidal neovascularization: Secondary | ICD-10-CM | POA: Diagnosis not present

## 2021-10-30 DIAGNOSIS — H353122 Nonexudative age-related macular degeneration, left eye, intermediate dry stage: Secondary | ICD-10-CM

## 2021-11-28 ENCOUNTER — Encounter (INDEPENDENT_AMBULATORY_CARE_PROVIDER_SITE_OTHER): Payer: Medicare Other | Admitting: Ophthalmology

## 2021-11-28 DIAGNOSIS — H353122 Nonexudative age-related macular degeneration, left eye, intermediate dry stage: Secondary | ICD-10-CM | POA: Diagnosis not present

## 2021-11-28 DIAGNOSIS — H35033 Hypertensive retinopathy, bilateral: Secondary | ICD-10-CM | POA: Diagnosis not present

## 2021-11-28 DIAGNOSIS — I10 Essential (primary) hypertension: Secondary | ICD-10-CM

## 2021-11-28 DIAGNOSIS — H43813 Vitreous degeneration, bilateral: Secondary | ICD-10-CM | POA: Diagnosis not present

## 2021-11-28 DIAGNOSIS — H353211 Exudative age-related macular degeneration, right eye, with active choroidal neovascularization: Secondary | ICD-10-CM

## 2021-12-28 ENCOUNTER — Encounter (INDEPENDENT_AMBULATORY_CARE_PROVIDER_SITE_OTHER): Payer: Medicare Other | Admitting: Ophthalmology

## 2022-01-01 ENCOUNTER — Encounter (INDEPENDENT_AMBULATORY_CARE_PROVIDER_SITE_OTHER): Payer: Medicare Other | Admitting: Ophthalmology

## 2022-01-01 DIAGNOSIS — I10 Essential (primary) hypertension: Secondary | ICD-10-CM | POA: Diagnosis not present

## 2022-01-01 DIAGNOSIS — H353211 Exudative age-related macular degeneration, right eye, with active choroidal neovascularization: Secondary | ICD-10-CM

## 2022-01-01 DIAGNOSIS — H353122 Nonexudative age-related macular degeneration, left eye, intermediate dry stage: Secondary | ICD-10-CM | POA: Diagnosis not present

## 2022-01-01 DIAGNOSIS — H43813 Vitreous degeneration, bilateral: Secondary | ICD-10-CM

## 2022-01-01 DIAGNOSIS — H35033 Hypertensive retinopathy, bilateral: Secondary | ICD-10-CM

## 2022-01-31 ENCOUNTER — Other Ambulatory Visit: Payer: Self-pay | Admitting: Neurology

## 2022-01-31 DIAGNOSIS — G250 Essential tremor: Secondary | ICD-10-CM

## 2022-02-26 ENCOUNTER — Encounter (INDEPENDENT_AMBULATORY_CARE_PROVIDER_SITE_OTHER): Payer: Medicare Other | Admitting: Ophthalmology

## 2022-02-26 DIAGNOSIS — H353211 Exudative age-related macular degeneration, right eye, with active choroidal neovascularization: Secondary | ICD-10-CM

## 2022-02-26 DIAGNOSIS — H353122 Nonexudative age-related macular degeneration, left eye, intermediate dry stage: Secondary | ICD-10-CM | POA: Diagnosis not present

## 2022-02-26 DIAGNOSIS — I10 Essential (primary) hypertension: Secondary | ICD-10-CM

## 2022-02-26 DIAGNOSIS — H35033 Hypertensive retinopathy, bilateral: Secondary | ICD-10-CM

## 2022-02-26 DIAGNOSIS — H43813 Vitreous degeneration, bilateral: Secondary | ICD-10-CM

## 2022-04-09 ENCOUNTER — Encounter (INDEPENDENT_AMBULATORY_CARE_PROVIDER_SITE_OTHER): Payer: Medicare Other | Admitting: Ophthalmology

## 2022-04-09 DIAGNOSIS — H353122 Nonexudative age-related macular degeneration, left eye, intermediate dry stage: Secondary | ICD-10-CM

## 2022-04-09 DIAGNOSIS — H353211 Exudative age-related macular degeneration, right eye, with active choroidal neovascularization: Secondary | ICD-10-CM | POA: Diagnosis not present

## 2022-04-09 DIAGNOSIS — H35033 Hypertensive retinopathy, bilateral: Secondary | ICD-10-CM | POA: Diagnosis not present

## 2022-04-09 DIAGNOSIS — H43813 Vitreous degeneration, bilateral: Secondary | ICD-10-CM

## 2022-04-09 DIAGNOSIS — I10 Essential (primary) hypertension: Secondary | ICD-10-CM | POA: Diagnosis not present

## 2022-04-19 ENCOUNTER — Ambulatory Visit: Payer: Medicare Other | Admitting: Adult Health

## 2022-04-19 ENCOUNTER — Encounter: Payer: Self-pay | Admitting: Adult Health

## 2022-04-19 VITALS — BP 166/91 | HR 71 | Ht 61.0 in | Wt 177.0 lb

## 2022-04-19 DIAGNOSIS — G250 Essential tremor: Secondary | ICD-10-CM | POA: Diagnosis not present

## 2022-04-19 DIAGNOSIS — G4733 Obstructive sleep apnea (adult) (pediatric): Secondary | ICD-10-CM

## 2022-04-19 NOTE — Patient Instructions (Signed)
Your Plan:  Please ensure you are taking propranol ER (extended release) '60mg'$  every morning  Start taking propranolol IR (immediate release) '10mg'$  tablets three times daily - if after 1-2 weeks no improvement, please let me know  I will send an order to [facility] to obtain new supplies      Follow up with Jinny Blossom, NP in 6 months or call earlier if needed      Thank you for coming to see [place] at [facility]. I hope we have been able to provide you high quality care today.  You may receive a patient satisfaction survey over the next few weeks. We would appreciate your feedback and comments so that we may continue to improve ourselves and the health of our patients.

## 2022-04-19 NOTE — Progress Notes (Signed)
PATIENT: Erica [name] DOB: [birth year]  REASON FOR VISIT: follow up HISTORY FROM: patient   Chief Complaint  Patient presents with   Follow-up    RM 3 alone Pt is well, reports water leak in CPAP. Tremors are worse       HISTORY OF PRESENT ILLNESS: Today 04/19/22:   Erica [name] returns for OSA on CPAP and essential tremor.  She is unaccompanied.  She was previously seen by Dr. Rexene Alberts on 10/05/2021 and stable at that time.    She reports tremors have worsened since prior visit, continuing to affect her hands, neck and voice.  Currently on primidone 50 mg twice daily and propranolol 12m BID (although prescribed TID) and propranolol ER 60 mg daily.  Denies any side effects on medications.  She remains on CPAP, her download indicates 80% compliance for both total usage and greater than 4 hours.  On average, uses machine 10 hours and 14 minutes.  Residual AHI 0.1 and set pressure 9 and EPR level 3.  Leaks in the 95th percentile 18.4. Reports CPAP leaking over the past 2-3 days, is due for new supplies.  Otherwise tolerating CPAP well.  Epworth Sleepiness Scale 4/24.    History provided for reference purposes only 02/21/2021 MM: Erica [name] an 88year old female with a history of obstructive sleep apnea on CPAP and essential tremor.  She returns today for follow-up.  She states that the dog chewed up her CPAP supplies and she is not got new supplies from her DME company.  She states that she has tried to reach out but she may have the wrong number.  Therefore she has not been using her CPAP machine.  She states that her tremors have remained relatively stable.  She states that she often forgets to take primidone in the morning.  She continues on propranolol extended release 60 mg daily in the immediate release 10 mg 3 times a day.  08/18/20 MM: Erica [name] an 88year old female with a history of obstructive sleep apnea on CPAP and essential tremor.  She reports that back in September she  was traveling in the dog chewed her CPAP machine.  She has not been using the machine since then.  She states that she did contact her DME company but she does not qualify for her machine.  She reports that she increase primidone to 50 mg twice a day.  She states that she does not take 50 mg in the morning consistently but reports that she tolerated it well and did see the benefit.  She has remained on propranolol.  02/16/20: Erica [name] an 88year old female with a history of obstructive sleep apnea on CPAP.  Her download indicates that she use her machine nightly for compliance of 100%.  She use her machine greater than 4 hours 28 days for compliance of 93%.  On average she uses her machine 7 hours and 57 minutes.  Her residual AHI is 2.2 on 9 cm of water with EPR 3.  Leak in the 95th percentile is 20.1 L/min.  She reports that she continues to have tremor in the hands, neck and voice.  She did not increase the primidone at the last visit.  She has remained on propranolol.  She takes the long-acting dose daily and if her tremors worsen she may take the 10 mg immediate release during the day.    She reports that in the past she has had episodes of vertigo that was resolved with  going to physical therapy to have the Epley maneuver completed.  She states for the last 3 to 4 days she feels like she is on the verge of a vertigo episode.  States that when she lays down in bed she will feel the room spinning but as long as she stays still the dizziness resolves.  She would like a referral to a physical therapist.  HISTORY Today 08/17/19:   Erica [name] is an 88 year old female with a history of obstructive sleep apnea on CPAP and essential tremor.  She returns today for follow-up.  Her download indicates that she use her machine nightly for compliance of 100%.  She use her machine greater than 4 hours each night.  On average she uses her machine 8 hours and 45 minutes.  Her residual AHI is 2.7 on 9 cm of water  with EPR 3.  Leak in the 95th percentile is 21.1 L/min.   She continues on propranolol and primidone for essential tremor.  The tremor primarily affects her hands and neck.  She feels that he is no better.  She is most disappointed in her handwriting.  She denies any significant trouble eating although she states that she does adjust depending on which works better for her spine.   She also has some concerns about her memory.  She states that she sometimes forgets people's names although these are people that she has not seen in quite some time.  She denies any trouble completing ADLs.  She operates a Teacher, music.  Denies any trouble managing her finances.  She returns today for an evaluation.    REVIEW OF SYSTEMS: Out of a complete 14 system review of symptoms, the patient complains only of the following symptoms, and all other reviewed systems are negative.    ALLERGIES: No Known Allergies  HOME MEDICATIONS: Outpatient Medications Prior to Visit  Medication Sig Dispense Refill   cholecalciferol (VITAMIN D) 1000 UNITS tablet Take by mouth daily.     co-enzyme Q-10 30 MG capsule Take 30 mg by mouth daily.     metFORMIN (GLUCOPHAGE) 500 MG tablet 250 mg.      methazolamide (NEPTAZANE) 50 MG tablet Take 1 tablet (50 mg total) by mouth daily. 90 tablet 3   Multiple Vitamins-Minerals (PRESERVISION AREDS 2 PO) Take by mouth.     omeprazole (PRILOSEC) 10 MG capsule Take 10 mg by mouth daily.     primidone (MYSOLINE) 50 MG tablet Take 1 tablet (50 mg total) by mouth in the morning and at bedtime. 180 tablet 3   propranolol (INDERAL) 10 MG tablet Take 1 tablet (10 mg total) by mouth 3 (three) times daily as needed. 270 tablet 1   propranolol ER (INDERAL LA) 60 MG 24 hr capsule TAKE ONE CAPSULE BY MOUTH DAILY 90 capsule 1   sertraline (ZOLOFT) 50 MG tablet Take 50 mg by mouth daily.     simvastatin (ZOCOR) 10 MG tablet 10 mg.      No facility-administered medications prior to visit.    PAST  MEDICAL HISTORY: Past Medical History:  Diagnosis Date   Cough    Depression    Meralgia paresthetica    Other and unspecified hyperlipidemia    Right knee pain    Tremor     PAST SURGICAL HISTORY: Past Surgical History:  Procedure Laterality Date   APPENDECTOMY     LUNG BIOPSY  2008   TONSILLECTOMY AND ADENOIDECTOMY      FAMILY HISTORY: Family History  Problem Relation  Age of Onset   Pancreatitis Mother    Tremor Father    Tremor Brother    Tremor Maternal Grandmother    Sleep apnea Neg Hx     SOCIAL HISTORY: Social History   Socioeconomic History   Marital status: Widowed    Spouse name: Not on file   Number of children: 3   Years of education: college   Highest education level: Not on file  Occupational History   Occupation: REALTOR    Employer: [employer]  Tobacco Use   Smoking status: Never   Smokeless tobacco: Never  Vaping Use   Vaping Use: Never used  Substance and Sexual Activity   Alcohol use: No   Drug use: No   Sexual activity: Not on file  Other Topics Concern   Not on file  Social History Narrative   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 Patient lives alone and she is widowed. Patient husband died from a vehicle  accident.    Right handed.   Social Determinants of Health   Financial Resource Strain: Not on file  Food Insecurity: Not on file  Transportation Needs: Not on file  Physical Activity: Not on file  Stress: Not on file  Social Connections: Not on file  Intimate Partner Violence: Not on file      PHYSICAL EXAM  Vitals:   04/19/22 1417  BP: (!) 166/91  Pulse: 71  Weight: 177 lb (80.3 kg)  Height: _0  (1.549 m)    Body mass index is 33.44 kg/m.  Generalized: Well developed, in no acute distress  Chest: Lungs clear to auscultation bilaterally  Neurological examination  Mentation: Alert oriented to time, place, history taking. Follows all commands speech and language  fluent Cranial nerve II-XII: Extraocular movements were full, visual field were full on confrontational test Head turning and shoulder shrug  were normal and symmetric.  Head tremor noted. Motor: The motor testing reveals 5 over 5 strength of all 4 extremities. Good symmetric motor tone is noted throughout.  Sensory: Sensory testing is intact to soft touch on all 4 extremities. No evidence of extinction is noted.  Essential tremor noted in the upper extremities  Gait and station: Gait is normal.    DIAGNOSTIC DATA (LABS, IMAGING, TESTING) - I reviewed patient records, labs, notes, testing and imaging myself where available.  Lab Results  Component Value Date   WBC 10.3 07/09/2008   HGB 14.3 07/09/2008   HCT 42.3 07/09/2008   MCV 90.3 07/09/2008   PLT 300 07/09/2008      Component Value Date/Time   NA 141 07/09/2008 2222   K 4.1 07/09/2008 2222   CL 108 07/09/2008 2222   CO2 22 07/09/2008 2222   GLUCOSE 129 (H) 07/09/2008 2222   BUN 8 07/09/2008 2222   CREATININE 0.73 07/09/2008 2222   CALCIUM 9.0 07/09/2008 2222   PROT 6.1 07/09/2008 2222   ALBUMIN 3.4 (L) 07/09/2008 2222   AST 19 07/09/2008 2222   ALT 19 07/09/2008 2222   ALKPHOS 67 07/09/2008 2222   BILITOT 1.1 07/09/2008 2222   GFRNONAA >60 07/09/2008 2222   GFRAA  07/09/2008 2222    >60        The eGFR has been calculated using the MDRD equation. This calculation has not been validated in all clinical situations. eGFR's persistently <60 mL/min signify possible Chronic Kidney Disease.      ASSESSMENT AND PLAN 88 y.o. year old female  has  a past medical history of Cough, Depression, Meralgia paresthetica, Other and unspecified hyperlipidemia, Right knee pain, and Tremor. here with:  OSA on CPAP  -Continue nightly use of CPAP with ensuring greater than 4 hours per night -Continue current pressure setting at 9 with EPR level 3 -Continue to follow with DME company for any needed supplies or CPAP related  concerns, order placed to obtain new CPAP supplies  2. Essential tremor  -Reports gradual worsening over the past 6 months -Recommend taking propranolol IR 114m 3 times daily, currently taking 168mBID -Continue primidone to 50 mg BID -continue propranolol ER 60 mg daily -She was advised to call after 1 to 2 weeks if no benefit for possible need of dosage increase if able   Advised if symptoms worsen or she develops new symptoms she should let usKoreanow FU 6 months     I spent 26 minutes of face-to-face and non-face-to-face time with patient.  This included previsit chart review, lab review, study review, order entry, electronic health record documentation, patient education and discussion regarding above diagnoses and treatment plan and answered all questions to patient's satisfaction  JeFrann RiderAGRiverpark Ambulatory Surgery CenterGuNorthern California Surgery [facility] LPeurological [facility] [address]uDixmoorr[place]udson[place] [id number] [phone]: This document was prepared with digital dictation and possible smart phrase technology. Any transcriptional errors that result from this process are unintentional.

## 2022-04-23 NOTE — Progress Notes (Signed)
Community message sent to DME on file [facility]. 04/23/22 New orders have been placed for the above pt, DOB:02/18/1932

## 2022-05-18 ENCOUNTER — Encounter (INDEPENDENT_AMBULATORY_CARE_PROVIDER_SITE_OTHER): Payer: Medicare Other | Admitting: Ophthalmology

## 2022-05-18 DIAGNOSIS — I10 Essential (primary) hypertension: Secondary | ICD-10-CM | POA: Diagnosis not present

## 2022-05-18 DIAGNOSIS — H353211 Exudative age-related macular degeneration, right eye, with active choroidal neovascularization: Secondary | ICD-10-CM

## 2022-05-18 DIAGNOSIS — H353122 Nonexudative age-related macular degeneration, left eye, intermediate dry stage: Secondary | ICD-10-CM

## 2022-05-18 DIAGNOSIS — H43813 Vitreous degeneration, bilateral: Secondary | ICD-10-CM | POA: Diagnosis not present

## 2022-05-18 DIAGNOSIS — H35033 Hypertensive retinopathy, bilateral: Secondary | ICD-10-CM | POA: Diagnosis not present

## 2022-07-16 ENCOUNTER — Encounter (INDEPENDENT_AMBULATORY_CARE_PROVIDER_SITE_OTHER): Payer: Medicare Other | Admitting: Ophthalmology

## 2022-07-16 DIAGNOSIS — H353122 Nonexudative age-related macular degeneration, left eye, intermediate dry stage: Secondary | ICD-10-CM

## 2022-07-16 DIAGNOSIS — H35033 Hypertensive retinopathy, bilateral: Secondary | ICD-10-CM | POA: Diagnosis not present

## 2022-07-16 DIAGNOSIS — H353211 Exudative age-related macular degeneration, right eye, with active choroidal neovascularization: Secondary | ICD-10-CM

## 2022-07-16 DIAGNOSIS — H43813 Vitreous degeneration, bilateral: Secondary | ICD-10-CM | POA: Diagnosis not present

## 2022-07-16 DIAGNOSIS — I10 Essential (primary) hypertension: Secondary | ICD-10-CM

## 2022-07-28 ENCOUNTER — Other Ambulatory Visit: Payer: Self-pay | Admitting: Neurology

## 2022-07-28 DIAGNOSIS — G250 Essential tremor: Secondary | ICD-10-CM

## 2022-08-02 ENCOUNTER — Other Ambulatory Visit: Payer: Self-pay | Admitting: *Deleted

## 2022-08-02 DIAGNOSIS — G250 Essential tremor: Secondary | ICD-10-CM

## 2022-08-02 MED ORDER — PRIMIDONE 50 MG PO TABS: 50.0000 mg | ORAL_TABLET | Freq: Two times a day (BID) | ORAL | 3 refills | Status: DC

## 2022-09-03 ENCOUNTER — Encounter (INDEPENDENT_AMBULATORY_CARE_PROVIDER_SITE_OTHER): Payer: Medicare Other | Admitting: Ophthalmology

## 2022-09-03 DIAGNOSIS — H35033 Hypertensive retinopathy, bilateral: Secondary | ICD-10-CM | POA: Diagnosis not present

## 2022-09-03 DIAGNOSIS — H43813 Vitreous degeneration, bilateral: Secondary | ICD-10-CM

## 2022-09-03 DIAGNOSIS — H353211 Exudative age-related macular degeneration, right eye, with active choroidal neovascularization: Secondary | ICD-10-CM

## 2022-09-03 DIAGNOSIS — I10 Essential (primary) hypertension: Secondary | ICD-10-CM

## 2022-09-03 DIAGNOSIS — H353122 Nonexudative age-related macular degeneration, left eye, intermediate dry stage: Secondary | ICD-10-CM

## 2022-10-22 ENCOUNTER — Encounter (INDEPENDENT_AMBULATORY_CARE_PROVIDER_SITE_OTHER): Payer: Medicare Other | Admitting: Ophthalmology

## 2022-10-22 DIAGNOSIS — H353122 Nonexudative age-related macular degeneration, left eye, intermediate dry stage: Secondary | ICD-10-CM

## 2022-10-22 DIAGNOSIS — H353211 Exudative age-related macular degeneration, right eye, with active choroidal neovascularization: Secondary | ICD-10-CM

## 2022-10-22 DIAGNOSIS — H35033 Hypertensive retinopathy, bilateral: Secondary | ICD-10-CM | POA: Diagnosis not present

## 2022-10-22 DIAGNOSIS — H43813 Vitreous degeneration, bilateral: Secondary | ICD-10-CM

## 2022-10-22 DIAGNOSIS — I10 Essential (primary) hypertension: Secondary | ICD-10-CM

## 2022-10-27 ENCOUNTER — Other Ambulatory Visit: Payer: Self-pay | Admitting: Neurology

## 2022-10-27 DIAGNOSIS — G250 Essential tremor: Secondary | ICD-10-CM

## 2022-10-30 ENCOUNTER — Other Ambulatory Visit: Payer: Self-pay | Admitting: Neurology

## 2022-10-30 DIAGNOSIS — G250 Essential tremor: Secondary | ICD-10-CM

## 2022-10-31 ENCOUNTER — Emergency Department (HOSPITAL_BASED_OUTPATIENT_CLINIC_OR_DEPARTMENT_OTHER): Payer: Medicare Other

## 2022-10-31 ENCOUNTER — Other Ambulatory Visit: Payer: Self-pay

## 2022-10-31 ENCOUNTER — Encounter (HOSPITAL_BASED_OUTPATIENT_CLINIC_OR_DEPARTMENT_OTHER): Payer: Self-pay

## 2022-10-31 DIAGNOSIS — Z79899 Other long term (current) drug therapy: Secondary | ICD-10-CM | POA: Diagnosis not present

## 2022-10-31 DIAGNOSIS — R260 Ataxic gait: Secondary | ICD-10-CM | POA: Diagnosis not present

## 2022-10-31 DIAGNOSIS — I10 Essential (primary) hypertension: Secondary | ICD-10-CM | POA: Insufficient documentation

## 2022-10-31 DIAGNOSIS — R42 Dizziness and giddiness: Secondary | ICD-10-CM | POA: Insufficient documentation

## 2022-10-31 LAB — CBC WITH DIFFERENTIAL/PLATELET
Abs Immature Granulocytes: 0.03 10*3/uL (ref 0.00–0.07)
Basophils Absolute: 0.1 10*3/uL (ref 0.0–0.1)
Basophils Relative: 1 %
Eosinophils Absolute: 0.2 10*3/uL (ref 0.0–0.5)
Eosinophils Relative: 2 %
HCT: 37.1 % (ref 36.0–46.0)
Hemoglobin: 11.9 g/dL — ABNORMAL LOW (ref 12.0–15.0)
Immature Granulocytes: 0 %
Lymphocytes Relative: 32 %
Lymphs Abs: 3.4 10*3/uL (ref 0.7–4.0)
MCH: 29.3 pg (ref 26.0–34.0)
MCHC: 32.1 g/dL (ref 30.0–36.0)
MCV: 91.4 fL (ref 80.0–100.0)
Monocytes Absolute: 0.9 10*3/uL (ref 0.1–1.0)
Monocytes Relative: 9 %
Neutro Abs: 6.2 10*3/uL (ref 1.7–7.7)
Neutrophils Relative %: 56 %
Platelets: 250 10*3/uL (ref 150–400)
RBC: 4.06 MIL/uL (ref 3.87–5.11)
RDW: 13.2 % (ref 11.5–15.5)
WBC: 10.7 10*3/uL — ABNORMAL HIGH (ref 4.0–10.5)
nRBC: 0 % (ref 0.0–0.2)

## 2022-10-31 LAB — COMPREHENSIVE METABOLIC PANEL
ALT: 11 U/L (ref 0–44)
AST: 16 U/L (ref 15–41)
Albumin: 4.1 g/dL (ref 3.5–5.0)
Alkaline Phosphatase: 62 U/L (ref 38–126)
Anion gap: 9 (ref 5–15)
BUN: 23 mg/dL (ref 8–23)
CO2: 26 mmol/L (ref 22–32)
Calcium: 9 mg/dL (ref 8.9–10.3)
Chloride: 101 mmol/L (ref 98–111)
Creatinine, Ser: 0.83 mg/dL (ref 0.44–1.00)
GFR, Estimated: >60 mL/min (ref >60)
Glucose, Bld: 102 mg/dL — ABNORMAL HIGH (ref 70–99)
Potassium: 4.1 mmol/L (ref 3.5–5.1)
Sodium: 136 mmol/L (ref 135–145)
Total Bilirubin: 0.3 mg/dL (ref 0.3–1.2)
Total Protein: 6.9 g/dL (ref 6.5–8.1)

## 2022-10-31 LAB — CBG MONITORING, ED: Glucose-Capillary: 104 mg/dL — ABNORMAL HIGH (ref 70–99)

## 2022-10-31 NOTE — ED Triage Notes (Signed)
Pt reports that earlier today (12-1p) "I had a little episode that I've never had before; my head was swimming & I had no balance- I was between a cabinet & table. It did not last a long time." Pt states she was able to seat herself, stayed seated "for awhile." States she's felt "off" all day."  Denies neuro, cardiac hx, family reports they'd like her checked out for "mini-stroke." No neurological deficit noted in triage.  Hx vertigo, "haven't had it in the last 3-66yrs."

## 2022-11-01 ENCOUNTER — Other Ambulatory Visit: Payer: Self-pay

## 2022-11-01 ENCOUNTER — Emergency Department (HOSPITAL_COMMUNITY): Payer: Medicare Other

## 2022-11-01 ENCOUNTER — Emergency Department (HOSPITAL_BASED_OUTPATIENT_CLINIC_OR_DEPARTMENT_OTHER)
Admission: EM | Admit: 2022-11-01 | Discharge: 2022-11-01 | Disposition: A | Discharge status: Home / Self Care | Payer: Medicare Other | Attending: Emergency Medicine | Admitting: Emergency Medicine

## 2022-11-01 DIAGNOSIS — R42 Dizziness and giddiness: Secondary | ICD-10-CM

## 2022-11-01 DIAGNOSIS — R270 Ataxia, unspecified: Secondary | ICD-10-CM

## 2022-11-01 NOTE — ED Notes (Signed)
Pt has arrived for MRI. A & O x 4. Pt states that she is not claustrophobic.

## 2022-11-01 NOTE — Telephone Encounter (Signed)
Hold for now. Last filled on 08/08/22. Patient will see Aundra Millet NP on Monday 5/6 and she has been in the ER today for acute dizziness.

## 2022-11-01 NOTE — Progress Notes (Signed)
PATIENT: Erica [name] DOB: Aug 12, 1931  REASON FOR VISIT: follow up HISTORY FROM: patient   Chief Complaint  Patient presents with   Follow-up    Pt in 19 with daughter  Pt here for CPAP f/u discuss MRI results Pt wants to discuss vertigo       HISTORY OF PRESENT ILLNESS:  Today 11/05/22:   LAIANNA [name] is a 89 y.o. female with a history of OSA on CPAP, essential tremor and vertigo. Returns today for follow-up.   OSA On CPAP: Working well.  Denies any new issues.  Download is below  Essential tremor: Feels that it is worse.  Continues on propranolol extended release daily, propranolol immediate release 3 times a day and primidone 50 mg twice a day.  She notices tremor in the upper extremities and in her neck.  Vertigo: Patient reports that she went to the emergency room earlier this week.  She states that she was in her kitchen and experienced vertigo.  She has a history of vertigo but reports that this time she felt more off balance.  It only lasted for approximately 30 seconds.  She reports that her family insisted that she go to the emergency room to rule out stroke.  She had an MRI of the brain that was relatively unremarkable.        Erica [name] returns for OSA on CPAP and essential tremor.  She is unaccompanied.  She was previously seen by Dr. Frances Furbish on 10/05/2021 and stable at that time.    She reports tremors have worsened since prior visit, continuing to affect her hands, neck and voice.  Currently on primidone 50 mg twice daily and propranolol 10mg  BID (although prescribed TID) and propranolol ER 60 mg daily.  Denies any side effects on medications.  She remains on CPAP, her download indicates 80% compliance for both total usage and greater than 4 hours.  On average, uses machine 10 hours and 14 minutes.  Residual AHI 0.1 and set pressure 9 and EPR level 3.  Leaks in the 95th percentile 18.4. Reports CPAP leaking over the past 2-3 days, is due for new supplies.   Otherwise tolerating CPAP well.  Epworth Sleepiness Scale 4/24.    History provided for reference purposes only 02/21/2021 MM: Erica [name] is an 89 year old female with a history of obstructive sleep apnea on CPAP and essential tremor.  She returns today for follow-up.  She states that the dog chewed up her CPAP supplies and she is not got new supplies from her DME company.  She states that she has tried to reach out but she may have the wrong number.  Therefore she has not been using her CPAP machine.  She states that her tremors have remained relatively stable.  She states that she often forgets to take primidone in the morning.  She continues on propranolol extended release 60 mg daily in the immediate release 10 mg 3 times a day.  08/18/20 MM: Erica [name] is an 89 year old female with a history of obstructive sleep apnea on CPAP and essential tremor.  She reports that back in September she was traveling in the dog chewed her CPAP machine.  She has not been using the machine since then.  She states that she did contact her DME company but she does not qualify for her machine.  She reports that she increase primidone to 50 mg twice a day.  She states that she does not take 50 mg in the morning  consistently but reports that she tolerated it well and did see the benefit.  She has remained on propranolol.  02/16/20: Erica [name] is an 89 year old female with a history of obstructive sleep apnea on CPAP.  Her download indicates that she use her machine nightly for compliance of 100%.  She use her machine greater than 4 hours 28 days for compliance of 93%.  On average she uses her machine 7 hours and 57 minutes.  Her residual AHI is 2.2 on 9 cm of water with EPR 3.  Leak in the 95th percentile is 20.1 L/min.  She reports that she continues to have tremor in the hands, neck and voice.  She did not increase the primidone at the last visit.  She has remained on propranolol.  She takes the long-acting dose daily and  if her tremors worsen she may take the 10 mg immediate release during the day.    She reports that in the past she has had episodes of vertigo that was resolved with going to physical therapy to have the Epley maneuver completed.  She states for the last 3 to 4 days she feels like she is on the verge of a vertigo episode.  States that when she lays down in bed she will feel the room spinning but as long as she stays still the dizziness resolves.  She would like a referral to a physical therapist.  HISTORY Today 08/17/19:   Erica [name] is an 89 year old female with a history of obstructive sleep apnea on CPAP and essential tremor.  She returns today for follow-up.  Her download indicates that she use her machine nightly for compliance of 100%.  She use her machine greater than 4 hours each night.  On average she uses her machine 8 hours and 45 minutes.  Her residual AHI is 2.7 on 9 cm of water with EPR 3.  Leak in the 95th percentile is 21.1 L/min.   She continues on propranolol and primidone for essential tremor.  The tremor primarily affects her hands and neck.  She feels that he is no better.  She is most disappointed in her handwriting.  She denies any significant trouble eating although she states that she does adjust depending on which works better for her spine.   She also has some concerns about her memory.  She states that she sometimes forgets people's names although these are people that she has not seen in quite some time.  She denies any trouble completing ADLs.  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  Denies any trouble managing her finances.  She returns today for an evaluation.    REVIEW OF SYSTEMS: Out of a complete 14 system review of symptoms, the patient complains only of the following symptoms, and all other reviewed systems are negative.    ALLERGIES: No Known Allergies  HOME MEDICATIONS: Outpatient Medications Prior to Visit  Medication Sig Dispense Refill   cholecalciferol (VITAMIN  D) 1000 UNITS tablet Take by mouth daily.     co-enzyme Q-10 30 MG capsule Take 30 mg by mouth daily.     metFORMIN (GLUCOPHAGE) 500 MG tablet 250 mg.      methazolamide (NEPTAZANE) 50 MG tablet Take 1 tablet (50 mg total) by mouth daily. 90 tablet 3   Multiple Vitamins-Minerals (PRESERVISION AREDS 2 PO) Take by mouth.     omeprazole (PRILOSEC) 10 MG capsule Take 10 mg by mouth daily.     primidone (MYSOLINE) 50 MG tablet Take 1 tablet (50 mg  total) by mouth in the morning and at bedtime. 180 tablet 3   propranolol (INDERAL) 10 MG tablet TAKE ONE TABLET BY MOUTH THREE TIMES DAILY AS NEEDED 270 tablet 1   propranolol ER (INDERAL LA) 60 MG 24 hr capsule TAKE ONE CAPSULE BY MOUTH DAILY 90 capsule 1   sertraline (ZOLOFT) 50 MG tablet Take 50 mg by mouth daily.     simvastatin (ZOCOR) 10 MG tablet 10 mg.      No facility-administered medications prior to visit.    PAST MEDICAL HISTORY: Past Medical History:  Diagnosis Date   Cough    Depression    Meralgia paresthetica    Other and unspecified hyperlipidemia    Right knee pain    Tremor     PAST SURGICAL HISTORY: Past Surgical History:  Procedure Laterality Date   APPENDECTOMY     LUNG BIOPSY  2008   TONSILLECTOMY AND ADENOIDECTOMY      FAMILY HISTORY: Family History  Problem Relation Age of Onset   Pancreatitis Mother    Tremor Father    Tremor Brother    Tremor Maternal Grandmother    Sleep apnea Neg Hx     SOCIAL HISTORY: Social History   Socioeconomic History   Marital status: Widowed    Spouse name: Not on file   Number of children: 3   Years of education: college   Highest education level: Not on file  Occupational History   Occupation: REALTOR    Employer: [employer]  Tobacco Use   Smoking status: Never   Smokeless tobacco: Never  Vaping Use   Vaping Use: Never used  Substance and Sexual Activity   Alcohol use: No   Drug use: No   Sexual activity: Not on file  Other Topics Concern   Not on  file  Social History Narrative   Patient is retired Veterinary surgeon for 33 years. Patient worked for [employer]. Patient lives alone and she is widowed. Patient husband died from a vehicle  accident.    Right handed.   Social Determinants of Health   Financial Resource Strain: Not on file  Food Insecurity: Not on file  Transportation Needs: Not on file  Physical Activity: Not on file  Stress: Not on file  Social Connections: Not on file  Intimate Partner Violence: Not on file      PHYSICAL EXAM  Vitals:   11/05/22 1035  BP: 137/82  Pulse: 66  Weight: 174 lb (78.9 kg)  Height: 5\' 1"  (1.549 m)    Body mass index is 32.88 kg/m.  Generalized: Well developed, in no acute distress  Chest: Lungs clear to auscultation bilaterally  Neurological examination  Mentation: Alert oriented to time, place, history taking. Follows all commands speech and language fluent Cranial nerve II-XII: Extraocular movements were full with horizontal gaze to the right.  Visual field were full on confrontational test Head turning and shoulder shrug  were normal and symmetric.  Head tremor noted. Motor: The motor testing reveals 5 over 5 strength of all 4 extremities. Good symmetric motor tone is noted throughout.  Sensory: Sensory testing is intact to soft touch on all 4 extremities. No evidence of extinction is noted.  Essential tremor noted in the upper extremities  Gait and station: Gait is normal.    DIAGNOSTIC DATA (LABS, IMAGING, TESTING) - I reviewed patient records, labs, notes, testing and imaging myself where available.  Lab Results  Component Value Date   WBC 10.7 (H) 10/31/2022   HGB  11.9 (L) 10/31/2022   HCT 37.1 10/31/2022   MCV 91.4 10/31/2022   PLT 250 10/31/2022      Component Value Date/Time   NA 136 10/31/2022 2235   K 4.1 10/31/2022 2235   CL 101 10/31/2022 2235   CO2 26 10/31/2022 2235   GLUCOSE 102 (H) 10/31/2022 2235   BUN 23 10/31/2022 2235   CREATININE 0.83 10/31/2022 2235    CALCIUM 9.0 10/31/2022 2235   PROT 6.9 10/31/2022 2235   ALBUMIN 4.1 10/31/2022 2235   AST 16 10/31/2022 2235   ALT 11 10/31/2022 2235   ALKPHOS 62 10/31/2022 2235   BILITOT 0.3 10/31/2022 2235   GFRNONAA >60 10/31/2022 2235   GFRAA  07/09/2008 2222    >60        The eGFR has been calculated using the MDRD equation. This calculation has not been validated in all clinical situations. eGFR's persistently <60 mL/min signify possible Chronic Kidney Disease.      ASSESSMENT AND PLAN 89 y.o. year old female  has a past medical history of Cough, Depression, Meralgia paresthetica, Other and unspecified hyperlipidemia, Right knee pain, and Tremor. here with:  OSA on CPAP  -Continue nightly use of CPAP with ensuring greater than 4 hours per night -Residual AHI is in normal range -Encouraged patient to use CPAP nightly greater than 4 hours each night  2. Essential tremor  -Reports tremor is worse -Continue taking propranolol IR 10 mg 3 times daily -Increase  primidone to 50 mg BID and add 25 mg at lunch  -continue propranolol ER 60 mg daily  3. Vertigo   -Had an episode of what she believes was vertigo.  She did go to the emergency room MRI was relatively unremarkable.  Advised that if she has any additional episodes we can consider vestibular rehab   Advised if symptoms worsen or she develops new symptoms she should let us know FU 6 months       Butch Penny, NP-C   [facility] [address] [place], [place] [address]  Phone [phone] Fax [phone]

## 2022-11-01 NOTE — ED Provider Notes (Signed)
[facility] for [facility] evaluation following episode of dizziness.  Patient is asymptomatic at time of ED transfer.  MRI is negative for acute abnormality.  Discussed findings of studies with family.  Plan to discharge with outpatient follow-up and return precautions.  Patient does have a neurology appointment on Monday.   Tilden Fossa, MD 11/01/22 [phone]

## 2022-11-01 NOTE — ED Provider Notes (Signed)
[facility] EMERGENCY DEPARTMENT AT [facility]  Provider Note  CSN: [id number] Arrival date & time: 10/31/22 2225  History Chief Complaint  Patient presents with   Dizziness    Erica [name] is a 89 y.o. female with history of HTN brought to the ED by daughter for evaluation of an episode of dizziness that happened around noon on 5/1 while she was cooking a meal. She reports feeling dizzy like room was spinning and was able to lower herself into a chair. The room spinning stopped after a short time but she felt off balance for quite a while afterwards. She has continued to feel 'off' during the day but was able to complete other tasks such as working in the garden. She was describing the symptoms to her daughter later in the evening and daughter was concerned this could have been a stroke prompting her ED visit.    Home Medications Prior to Admission medications   Medication Sig Start Date End Date Taking? Authorizing Provider  cholecalciferol (VITAMIN D) 1000 UNITS tablet Take by mouth daily.    [provider]  co-enzyme Q-10 30 MG capsule Take 30 mg by mouth daily.    [provider]  metFORMIN (GLUCOPHAGE) 500 MG tablet 250 mg.  01/26/15   [provider]  methazolamide (NEPTAZANE) 50 MG tablet Take 1 tablet (50 mg total) by mouth daily. 02/22/15   Huston Foley, MD  Multiple Vitamins-Minerals (PRESERVISION AREDS 2 PO) Take by mouth.    [provider]  omeprazole (PRILOSEC) 10 MG capsule Take 10 mg by mouth daily.    [provider]  primidone (MYSOLINE) 50 MG tablet Take 1 tablet (50 mg total) by mouth in the morning and at bedtime. 08/02/22   Butch Penny, NP  propranolol (INDERAL) 10 MG tablet TAKE ONE TABLET BY MOUTH THREE TIMES DAILY AS NEEDED 08/02/22   Huston Foley, MD  propranolol ER (INDERAL LA) 60 MG 24 hr capsule TAKE ONE CAPSULE BY MOUTH DAILY 01/31/22   Huston Foley, MD  sertraline (ZOLOFT) 50 MG tablet Take 50 mg by mouth  daily.    [provider]  simvastatin (ZOCOR) 10 MG tablet 10 mg.  02/09/15   [provider]     Allergies    Patient has no known allergies.   Review of Systems   Review of Systems Please see HPI for pertinent positives and negatives  Physical Exam BP (!) 176/64 (BP Location: Right Arm)   Pulse 60   Temp 98 F (36.7 C) (Oral)   Resp 19   SpO2 98%   Physical Exam Vitals and nursing note reviewed.  Constitutional:      Appearance: Normal appearance.  HENT:     Head: Normocephalic and atraumatic.     Nose: Nose normal.     Mouth/Throat:     Mouth: Mucous membranes are moist.  Eyes:     Extraocular Movements: Extraocular movements intact.     Conjunctiva/sclera: Conjunctivae normal.  Cardiovascular:     Rate and Rhythm: Normal rate.  Pulmonary:     Effort: Pulmonary effort is normal.     Breath sounds: Normal breath sounds.  Abdominal:     General: Abdomen is flat.     Palpations: Abdomen is soft.     Tenderness: There is no abdominal tenderness.  Musculoskeletal:        General: No swelling. Normal range of motion.     Cervical back: Neck supple.  Skin:  General: Skin is warm and dry.  Neurological:     General: No focal deficit present.     Mental Status: She is alert and oriented to person, place, and time.     Cranial Nerves: No cranial nerve deficit.     Sensory: No sensory deficit.     Motor: No weakness.     Coordination: Coordination normal.     Gait: Gait normal.  Psychiatric:        Mood and Affect: Mood normal.     ED Results / Procedures / Treatments   EKG None  Procedures Procedures  Medications Ordered in the ED Medications - No data to display  Initial Impression and Plan  Patient here with an episode of dizziness and ataxia earlier in the day. She is asymptomatic now with a normal neuro exam. Labs done in triage show a normal CBC, CMP. I personally viewed the images from radiology studies and agree with  radiologist interpretation: CT head without acute findings. Discussed further workup including MRI with patient and daughter. They would like to proceed. Advised they will need to go to the [facility], Dr. Madilyn Hook aware. Daughter is comfortable driving the patient.   ED Course       MDM Rules/Calculators/A&P Medical Decision Making Problems Addressed: Ataxia: acute illness or injury  Amount and/or Complexity of Data Reviewed Labs: ordered. Decision-making details documented in ED Course. Radiology: ordered and independent interpretation performed. Decision-making details documented in ED Course. ECG/medicine tests: ordered and independent interpretation performed. Decision-making details documented in ED Course.     Final Clinical Impression(s) / ED Diagnoses Final diagnoses:  Ataxia    Rx / DC Orders ED Discharge Orders     None        Pollyann Savoy, MD 11/01/22 [phone]

## 2022-11-05 ENCOUNTER — Ambulatory Visit: Payer: Medicare Other | Admitting: Adult Health

## 2022-11-05 ENCOUNTER — Encounter: Payer: Self-pay | Admitting: Adult Health

## 2022-11-05 VITALS — BP 137/82 | HR 66 | Ht 61.0 in | Wt 174.0 lb

## 2022-11-05 DIAGNOSIS — G250 Essential tremor: Secondary | ICD-10-CM | POA: Diagnosis not present

## 2022-11-05 DIAGNOSIS — G4733 Obstructive sleep apnea (adult) (pediatric): Secondary | ICD-10-CM | POA: Diagnosis not present

## 2022-11-05 DIAGNOSIS — R42 Dizziness and giddiness: Secondary | ICD-10-CM | POA: Diagnosis not present

## 2022-11-05 MED ORDER — PRIMIDONE 50 MG PO TABS
ORAL_TABLET | ORAL | 3 refills | Status: DC
Start: 2022-11-05 — End: 2023-06-10

## 2022-11-05 NOTE — Telephone Encounter (Signed)
Erica [name] called for [name]. Stated pt needs refill on propranolol ER (INDERAL LA) 60 MG 24 hr capsule. She is asking for a refill to be sent over. Stated pt is out medication.

## 2022-11-05 NOTE — Patient Instructions (Addendum)
-  Continue taking propranolol IR 10 mg 3 times daily -Increase  primidone to 50 mg BID and add 25 mg at lunch  -continue propranolol ER 60 mg daily - continue CPAP  - Call if you have additional episodes of dizziness

## 2022-12-10 ENCOUNTER — Encounter (INDEPENDENT_AMBULATORY_CARE_PROVIDER_SITE_OTHER): Payer: Medicare Other | Admitting: Ophthalmology

## 2022-12-10 DIAGNOSIS — H353211 Exudative age-related macular degeneration, right eye, with active choroidal neovascularization: Secondary | ICD-10-CM | POA: Diagnosis not present

## 2022-12-10 DIAGNOSIS — H35033 Hypertensive retinopathy, bilateral: Secondary | ICD-10-CM

## 2022-12-10 DIAGNOSIS — I10 Essential (primary) hypertension: Secondary | ICD-10-CM

## 2022-12-10 DIAGNOSIS — H43813 Vitreous degeneration, bilateral: Secondary | ICD-10-CM

## 2022-12-10 DIAGNOSIS — H353122 Nonexudative age-related macular degeneration, left eye, intermediate dry stage: Secondary | ICD-10-CM | POA: Diagnosis not present

## 2023-01-21 ENCOUNTER — Encounter (INDEPENDENT_AMBULATORY_CARE_PROVIDER_SITE_OTHER): Payer: Medicare Other | Admitting: Ophthalmology

## 2023-01-21 DIAGNOSIS — I10 Essential (primary) hypertension: Secondary | ICD-10-CM | POA: Diagnosis not present

## 2023-01-21 DIAGNOSIS — H43813 Vitreous degeneration, bilateral: Secondary | ICD-10-CM

## 2023-01-21 DIAGNOSIS — H353211 Exudative age-related macular degeneration, right eye, with active choroidal neovascularization: Secondary | ICD-10-CM | POA: Diagnosis not present

## 2023-01-21 DIAGNOSIS — H35033 Hypertensive retinopathy, bilateral: Secondary | ICD-10-CM | POA: Diagnosis not present

## 2023-01-21 DIAGNOSIS — H353122 Nonexudative age-related macular degeneration, left eye, intermediate dry stage: Secondary | ICD-10-CM | POA: Diagnosis not present

## 2023-01-25 ENCOUNTER — Other Ambulatory Visit: Payer: Self-pay | Admitting: Neurology

## 2023-01-25 DIAGNOSIS — G250 Essential tremor: Secondary | ICD-10-CM

## 2023-01-30 ENCOUNTER — Other Ambulatory Visit: Payer: Self-pay | Admitting: Adult Health

## 2023-01-30 DIAGNOSIS — G250 Essential tremor: Secondary | ICD-10-CM

## 2023-01-31 MED ORDER — PROPRANOLOL HCL 10 MG PO TABS
10.0000 mg | ORAL_TABLET | Freq: Three times a day (TID) | ORAL | 0 refills | Status: DC | PRN
Start: 2023-01-31 — End: 2023-04-25

## 2023-01-31 NOTE — Addendum Note (Signed)
Addended by: Bertram Savin on: 01/31/2023 04:13 PM   Modules accepted: Orders

## 2023-03-06 ENCOUNTER — Encounter (INDEPENDENT_AMBULATORY_CARE_PROVIDER_SITE_OTHER): Payer: Medicare Other | Admitting: Ophthalmology

## 2023-03-06 DIAGNOSIS — H35033 Hypertensive retinopathy, bilateral: Secondary | ICD-10-CM | POA: Diagnosis not present

## 2023-03-06 DIAGNOSIS — I10 Essential (primary) hypertension: Secondary | ICD-10-CM | POA: Diagnosis not present

## 2023-03-06 DIAGNOSIS — H43813 Vitreous degeneration, bilateral: Secondary | ICD-10-CM

## 2023-03-06 DIAGNOSIS — H353211 Exudative age-related macular degeneration, right eye, with active choroidal neovascularization: Secondary | ICD-10-CM

## 2023-03-06 DIAGNOSIS — H353122 Nonexudative age-related macular degeneration, left eye, intermediate dry stage: Secondary | ICD-10-CM | POA: Diagnosis not present

## 2023-04-22 ENCOUNTER — Encounter (INDEPENDENT_AMBULATORY_CARE_PROVIDER_SITE_OTHER): Payer: Medicare Other | Admitting: Ophthalmology

## 2023-04-25 ENCOUNTER — Other Ambulatory Visit: Payer: Self-pay | Admitting: Adult Health

## 2023-04-25 DIAGNOSIS — G250 Essential tremor: Secondary | ICD-10-CM

## 2023-04-29 ENCOUNTER — Encounter (INDEPENDENT_AMBULATORY_CARE_PROVIDER_SITE_OTHER): Payer: Medicare Other | Admitting: Ophthalmology

## 2023-04-29 DIAGNOSIS — I10 Essential (primary) hypertension: Secondary | ICD-10-CM

## 2023-04-29 DIAGNOSIS — H35033 Hypertensive retinopathy, bilateral: Secondary | ICD-10-CM | POA: Diagnosis not present

## 2023-04-29 DIAGNOSIS — H353122 Nonexudative age-related macular degeneration, left eye, intermediate dry stage: Secondary | ICD-10-CM

## 2023-04-29 DIAGNOSIS — H43813 Vitreous degeneration, bilateral: Secondary | ICD-10-CM

## 2023-04-29 DIAGNOSIS — H353211 Exudative age-related macular degeneration, right eye, with active choroidal neovascularization: Secondary | ICD-10-CM | POA: Diagnosis not present

## 2023-06-06 NOTE — Progress Notes (Signed)
PATIENT: Erica [name] DOB: Dec 20, 1931  REASON FOR VISIT: follow up HISTORY FROM: patient   Chief Complaint  Patient presents with   Follow-up    Rm 19, alone.  No concerns wotj cpp.  Has been OOT, readjusting , tremors worse (bilateral).  EPW SS 8.      HISTORY OF PRESENT ILLNESS:  Today 06/10/23  Erica [name] is a 89 y.o. female with a history of OSA on CPAP. Returns today for follow-up.  She reports that CPAP is working well. DL is below. She reports that tremor is worse. Currently primarily in her neck and upper extremities.  Remains on propranolol LA 60 mg daily and propranolol immediate release 10 mg twice a day.  Tolerates the medication well.  Denies any significant side effects.      06/10/23:   Erica [name] is a 89 y.o. female with a history of OSA on CPAP, essential tremor and vertigo. Returns today for follow-up.   OSA On CPAP: Working well.  Denies any new issues.  Download is below  Essential tremor: Feels that it is worse.  Continues on propranolol extended release daily, propranolol immediate release 3 times a day and primidone 50 mg twice a day.  She notices tremor in the upper extremities and in her neck.  Vertigo: Patient reports that she went to the emergency room earlier this week.  She states that she was in her kitchen and experienced vertigo.  She has a history of vertigo but reports that this time she felt more off balance.  It only lasted for approximately 30 seconds.  She reports that her family insisted that she go to the emergency room to rule out stroke.  She had an MRI of the brain that was relatively unremarkable.        Erica [name] returns for OSA on CPAP and essential tremor.  She is unaccompanied.  She was previously seen by Dr. Frances Furbish on 10/05/2021 and stable at that time.    She reports tremors have worsened since prior visit, continuing to affect her hands, neck and voice.  Currently on primidone 50 mg twice daily and propranolol  10mg  BID (although prescribed TID) and propranolol ER 60 mg daily.  Denies any side effects on medications.  She remains on CPAP, her download indicates 80% compliance for both total usage and greater than 4 hours.  On average, uses machine 10 hours and 14 minutes.  Residual AHI 0.1 and set pressure 9 and EPR level 3.  Leaks in the 95th percentile 18.4. Reports CPAP leaking over the past 2-3 days, is due for new supplies.  Otherwise tolerating CPAP well.  Epworth Sleepiness Scale 4/24.    History provided for reference purposes only 02/21/2021 MM: Erica [name] is an 89 year old female with a history of obstructive sleep apnea on CPAP and essential tremor.  She returns today for follow-up.  She states that the dog chewed up her CPAP supplies and she is not got new supplies from her DME company.  She states that she has tried to reach out but she may have the wrong number.  Therefore she has not been using her CPAP machine.  She states that her tremors have remained relatively stable.  She states that she often forgets to take primidone in the morning.  She continues on propranolol extended release 60 mg daily in the immediate release 10 mg 3 times a day.  08/18/20 MM: Erica [name] is an 89 year old female with a history  of obstructive sleep apnea on CPAP and essential tremor.  She reports that back in September she was traveling in the dog chewed her CPAP machine.  She has not been using the machine since then.  She states that she did contact her DME company but she does not qualify for her machine.  She reports that she increase primidone to 50 mg twice a day.  She states that she does not take 50 mg in the morning consistently but reports that she tolerated it well and did see the benefit.  She has remained on propranolol.  02/16/20: Erica [name] is an 89 year old female with a history of obstructive sleep apnea on CPAP.  Her download indicates that she use her machine nightly for compliance of 100%.  She use  her machine greater than 4 hours 28 days for compliance of 93%.  On average she uses her machine 7 hours and 57 minutes.  Her residual AHI is 2.2 on 9 cm of water with EPR 3.  Leak in the 95th percentile is 20.1 L/min.  She reports that she continues to have tremor in the hands, neck and voice.  She did not increase the primidone at the last visit.  She has remained on propranolol.  She takes the long-acting dose daily and if her tremors worsen she may take the 10 mg immediate release during the day.    She reports that in the past she has had episodes of vertigo that was resolved with going to physical therapy to have the Epley maneuver completed.  She states for the last 3 to 4 days she feels like she is on the verge of a vertigo episode.  States that when she lays down in bed she will feel the room spinning but as long as she stays still the dizziness resolves.  She would like a referral to a physical therapist.  HISTORY Today 08/17/19:   Erica [name] is an 89 year old female with a history of obstructive sleep apnea on CPAP and essential tremor.  She returns today for follow-up.  Her download indicates that she use her machine nightly for compliance of 100%.  She use her machine greater than 4 hours each night.  On average she uses her machine 8 hours and 45 minutes.  Her residual AHI is 2.7 on 9 cm of water with EPR 3.  Leak in the 95th percentile is 21.1 L/min.   She continues on propranolol and primidone for essential tremor.  The tremor primarily affects her hands and neck.  She feels that he is no better.  She is most disappointed in her handwriting.  She denies any significant trouble eating although she states that she does adjust depending on which works better for her spine.   She also has some concerns about her memory.  She states that she sometimes forgets people's names although these are people that she has not seen in quite some time.  She denies any trouble completing ADLs.  She operates  a Librarian, academic.  Denies any trouble managing her finances.  She returns today for an evaluation.    REVIEW OF SYSTEMS: Out of a complete 14 system review of symptoms, the patient complains only of the following symptoms, and all other reviewed systems are negative.    ALLERGIES: No Known Allergies  HOME MEDICATIONS: Outpatient Medications Prior to Visit  Medication Sig Dispense Refill   cholecalciferol (VITAMIN D) 1000 UNITS tablet Take by mouth daily.     co-enzyme Q-10 30 MG  capsule Take 30 mg by mouth daily.     metFORMIN (GLUCOPHAGE) 500 MG tablet 250 mg.      methazolamide (NEPTAZANE) 50 MG tablet Take 1 tablet (50 mg total) by mouth daily. 90 tablet 3   Multiple Vitamins-Minerals (PRESERVISION AREDS 2 PO) Take by mouth.     omeprazole (PRILOSEC) 10 MG capsule Take 10 mg by mouth daily.     primidone (MYSOLINE) 50 MG tablet Take 1 tablet BID and 1/2 tablet at bedtime 225 tablet 3   propranolol (INDERAL) 10 MG tablet Take 1 tablet (10 mg total) by mouth 3 (three) times daily as needed. 270 tablet 0   propranolol ER (INDERAL LA) 60 MG 24 hr capsule TAKE ONE CAPSULE BY MOUTH DAILY 90 capsule 1   sertraline (ZOLOFT) 50 MG tablet Take 50 mg by mouth daily.     simvastatin (ZOCOR) 10 MG tablet 10 mg.      No facility-administered medications prior to visit.    PAST MEDICAL HISTORY: Past Medical History:  Diagnosis Date   Cough    Depression    Meralgia paresthetica    Other and unspecified hyperlipidemia    Right knee pain    Tremor     PAST SURGICAL HISTORY: Past Surgical History:  Procedure Laterality Date   APPENDECTOMY     LUNG BIOPSY  2008   TONSILLECTOMY AND ADENOIDECTOMY      FAMILY HISTORY: Family History  Problem Relation Age of Onset   Pancreatitis Mother    Tremor Father    Tremor Brother    Tremor Maternal Grandmother    Sleep apnea Neg Hx     SOCIAL HISTORY: Social History   Socioeconomic History   Marital status: Widowed    Spouse name: Not  on file   Number of children: 3   Years of education: college   Highest education level: Not on file  Occupational History   Occupation: REALTOR    Employer: [employer]  Tobacco Use   Smoking status: Never   Smokeless tobacco: Never  Vaping Use   Vaping status: Never Used  Substance and Sexual Activity   Alcohol use: No   Drug use: No   Sexual activity: Not on file  Other Topics Concern   Not on file  Social History Narrative   Patient is retired Veterinary surgeon for 33 years. Patient worked for [employer]. Patient lives alone and she is widowed. Patient husband died from a vehicle  accident.    Right handed.   Social Determinants of Health   Financial Resource Strain: Not on file  Food Insecurity: Not on file  Transportation Needs: Not on file  Physical Activity: Not on file  Stress: Not on file  Social Connections: Not on file  Intimate Partner Violence: Not on file      PHYSICAL EXAM  Vitals:   06/10/23 1100  BP: (!) 140/58  Pulse: 62  SpO2: 95%  Weight: 179 lb 12.8 oz (81.6 kg)  Height: 5\' 1"  (1.549 m)    Body mass index is 33.97 kg/m.  Generalized: Well developed, in no acute distress    Neurological examination  Mentation: Alert oriented to time, place, history taking. Follows all commands speech and language fluent Cranial nerve II-XII: Extraocular movements were full with horizontal gaze to the right.  Visual field were full on confrontational test Head turning and shoulder shrug  were normal and symmetric.  Head tremor noted. Motor: The motor testing reveals 5 over 5 strength of all  4 extremities. Good symmetric motor tone is noted throughout.  Sensory: Sensory testing is intact to soft touch on all 4 extremities. No evidence of extinction is noted.  Essential tremor noted in the upper extremities  Gait and station: Gait is normal.    DIAGNOSTIC DATA (LABS, IMAGING, TESTING) - I reviewed patient records, labs, notes, testing and imaging myself where  available.  Lab Results  Component Value Date   WBC 10.7 (H) 10/31/2022   HGB 11.9 (L) 10/31/2022   HCT 37.1 10/31/2022   MCV 91.4 10/31/2022   PLT 250 10/31/2022      Component Value Date/Time   NA 136 10/31/2022 2235   K 4.1 10/31/2022 2235   CL 101 10/31/2022 2235   CO2 26 10/31/2022 2235   GLUCOSE 102 (H) 10/31/2022 2235   BUN 23 10/31/2022 2235   CREATININE 0.83 10/31/2022 2235   CALCIUM 9.0 10/31/2022 2235   PROT 6.9 10/31/2022 2235   ALBUMIN 4.1 10/31/2022 2235   AST 16 10/31/2022 2235   ALT 11 10/31/2022 2235   ALKPHOS 62 10/31/2022 2235   BILITOT 0.3 10/31/2022 2235   GFRNONAA >60 10/31/2022 2235   GFRAA  07/09/2008 2222    >60        The eGFR has been calculated using the MDRD equation. This calculation has not been validated in all clinical situations. eGFR's persistently <60 mL/min signify possible Chronic Kidney Disease.      ASSESSMENT AND PLAN 89 y.o. year old female  has a past medical history of Cough, Depression, Meralgia paresthetica, Other and unspecified hyperlipidemia, Right knee pain, and Tremor. here with:  OSA on CPAP  -Continue nightly use of CPAP with ensuring greater than 4 hours per night -Residual AHI is in normal range -Encouraged patient to use CPAP nightly greater than 4 hours each night  2. Essential tremor  -Reports tremor is worse -Continue taking propranolol IR 10 mg 3 times daily -Increase  primidone 50 mg to 1.5 tablets twice a day -continue propranolol ER 60 mg daily    Advised if symptoms worsen or she develops new symptoms she should let us know FU 6 months       Butch Penny, NP-C   [facility] [address] [place], [place] [address]  Phone [phone] Fax [phone]

## 2023-06-10 ENCOUNTER — Encounter (INDEPENDENT_AMBULATORY_CARE_PROVIDER_SITE_OTHER): Payer: Medicare Other | Admitting: Ophthalmology

## 2023-06-10 ENCOUNTER — Encounter: Payer: Self-pay | Admitting: Adult Health

## 2023-06-10 ENCOUNTER — Ambulatory Visit: Payer: Medicare Other | Admitting: Adult Health

## 2023-06-10 DIAGNOSIS — H353122 Nonexudative age-related macular degeneration, left eye, intermediate dry stage: Secondary | ICD-10-CM | POA: Diagnosis not present

## 2023-06-10 DIAGNOSIS — H43813 Vitreous degeneration, bilateral: Secondary | ICD-10-CM

## 2023-06-10 DIAGNOSIS — H35033 Hypertensive retinopathy, bilateral: Secondary | ICD-10-CM | POA: Diagnosis not present

## 2023-06-10 DIAGNOSIS — G250 Essential tremor: Secondary | ICD-10-CM | POA: Diagnosis not present

## 2023-06-10 DIAGNOSIS — I10 Essential (primary) hypertension: Secondary | ICD-10-CM | POA: Diagnosis not present

## 2023-06-10 DIAGNOSIS — H353211 Exudative age-related macular degeneration, right eye, with active choroidal neovascularization: Secondary | ICD-10-CM | POA: Diagnosis not present

## 2023-06-10 MED ORDER — PRIMIDONE 50 MG PO TABS: 75.0000 mg | ORAL_TABLET | Freq: Two times a day (BID) | ORAL | 11 refills | Status: DC

## 2023-06-10 NOTE — Patient Instructions (Signed)
Your Plan:  Continue CPAP  Increase Primidone to 1.5 tablets twice a day Continue Propranolol LA daily  Continue Propranolol IR three times a day If your symptoms worsen or you develop new symptoms please let us know.    Thank you for coming to see [place] at [facility]. I hope we have been able to provide you high quality care today.  You may receive a patient satisfaction survey over the next few weeks. We would appreciate your feedback and comments so that we may continue to improve ourselves and the health of our patients.

## 2023-07-22 ENCOUNTER — Encounter (INDEPENDENT_AMBULATORY_CARE_PROVIDER_SITE_OTHER): Payer: Medicare Other | Admitting: Ophthalmology

## 2023-07-22 DIAGNOSIS — H35033 Hypertensive retinopathy, bilateral: Secondary | ICD-10-CM | POA: Diagnosis not present

## 2023-07-22 DIAGNOSIS — H43813 Vitreous degeneration, bilateral: Secondary | ICD-10-CM

## 2023-07-22 DIAGNOSIS — H353211 Exudative age-related macular degeneration, right eye, with active choroidal neovascularization: Secondary | ICD-10-CM

## 2023-07-22 DIAGNOSIS — H353122 Nonexudative age-related macular degeneration, left eye, intermediate dry stage: Secondary | ICD-10-CM

## 2023-07-22 DIAGNOSIS — I10 Essential (primary) hypertension: Secondary | ICD-10-CM | POA: Diagnosis not present

## 2023-07-24 ENCOUNTER — Other Ambulatory Visit: Payer: Self-pay | Admitting: Adult Health

## 2023-07-24 DIAGNOSIS — G250 Essential tremor: Secondary | ICD-10-CM

## 2023-08-08 ENCOUNTER — Telehealth: Payer: Self-pay | Admitting: Neurology

## 2023-08-08 NOTE — Telephone Encounter (Signed)
 LVM and sent mychart msg informing pt of need to reschedule 12/09/23 appt - MD out

## 2023-08-26 ENCOUNTER — Encounter (INDEPENDENT_AMBULATORY_CARE_PROVIDER_SITE_OTHER): Payer: Medicare Other | Admitting: Ophthalmology

## 2023-08-26 DIAGNOSIS — H353211 Exudative age-related macular degeneration, right eye, with active choroidal neovascularization: Secondary | ICD-10-CM | POA: Diagnosis not present

## 2023-08-26 DIAGNOSIS — I10 Essential (primary) hypertension: Secondary | ICD-10-CM

## 2023-08-26 DIAGNOSIS — H35033 Hypertensive retinopathy, bilateral: Secondary | ICD-10-CM | POA: Diagnosis not present

## 2023-08-26 DIAGNOSIS — H43813 Vitreous degeneration, bilateral: Secondary | ICD-10-CM

## 2023-08-26 DIAGNOSIS — H353122 Nonexudative age-related macular degeneration, left eye, intermediate dry stage: Secondary | ICD-10-CM

## 2023-10-03 ENCOUNTER — Encounter (INDEPENDENT_AMBULATORY_CARE_PROVIDER_SITE_OTHER): Payer: Medicare Other | Admitting: Ophthalmology

## 2023-10-03 DIAGNOSIS — I10 Essential (primary) hypertension: Secondary | ICD-10-CM | POA: Diagnosis not present

## 2023-10-03 DIAGNOSIS — H353211 Exudative age-related macular degeneration, right eye, with active choroidal neovascularization: Secondary | ICD-10-CM

## 2023-10-03 DIAGNOSIS — H35033 Hypertensive retinopathy, bilateral: Secondary | ICD-10-CM

## 2023-10-03 DIAGNOSIS — H43813 Vitreous degeneration, bilateral: Secondary | ICD-10-CM

## 2023-10-03 DIAGNOSIS — H353122 Nonexudative age-related macular degeneration, left eye, intermediate dry stage: Secondary | ICD-10-CM

## 2023-10-07 ENCOUNTER — Ambulatory Visit (INDEPENDENT_AMBULATORY_CARE_PROVIDER_SITE_OTHER): Payer: Medicare Other

## 2023-10-07 ENCOUNTER — Encounter (INDEPENDENT_AMBULATORY_CARE_PROVIDER_SITE_OTHER): Payer: Self-pay

## 2023-10-07 ENCOUNTER — Ambulatory Visit (INDEPENDENT_AMBULATORY_CARE_PROVIDER_SITE_OTHER): Payer: Medicare Other | Admitting: Audiology

## 2023-10-07 VITALS — BP 132/80 | HR 69 | Ht 61.0 in | Wt 179.0 lb

## 2023-10-07 DIAGNOSIS — H9011 Conductive hearing loss, unilateral, right ear, with unrestricted hearing on the contralateral side: Secondary | ICD-10-CM | POA: Diagnosis not present

## 2023-10-07 DIAGNOSIS — H903 Sensorineural hearing loss, bilateral: Secondary | ICD-10-CM

## 2023-10-08 DIAGNOSIS — H903 Sensorineural hearing loss, bilateral: Secondary | ICD-10-CM | POA: Insufficient documentation

## 2023-10-08 NOTE — Progress Notes (Signed)
 Patient ID: Erica [name], female   DOB: [birth year], 89 y.o.   MRN: [id number]  CC: Hearing loss  HPI:  Erica [name] is a 89 y.o. female who presents today complaining of bilateral hearing loss.  The patient has a history of bilateral sensorineural hearing loss.  She currently wears bilateral hearing aids.  The hearing aids were fitted at [facility].  She was recently evaluated at the [facility] audiology department.  She was noted to have bilateral high-frequency sensorineural hearing loss, with additional right ear conductive hearing loss at low frequencies.  Her tympanograms were normal bilaterally.  The patient currently denies any otalgia, otorrhea, or vertigo.  She has no previous otologic surgery.  She also denies any family history of otosclerosis.  Past Medical History:  Diagnosis Date   Cough    Depression    Meralgia paresthetica    Other and unspecified hyperlipidemia    Right knee pain    Tremor     Past Surgical History:  Procedure Laterality Date   APPENDECTOMY     LUNG BIOPSY  2008   TONSILLECTOMY AND ADENOIDECTOMY      Family History  Problem Relation Age of Onset   Pancreatitis Mother    Tremor Father    Tremor Brother    Tremor Maternal Grandmother    Sleep apnea Neg Hx     Social History:  reports that she has never smoked. She has never used smokeless tobacco. She reports that she does not drink alcohol and does not use drugs.  Allergies: No Known Allergies  Prior to Admission medications   Medication Sig Start Date End Date Taking? Authorizing Provider  cholecalciferol (VITAMIN D) 1000 UNITS tablet Take by mouth daily.   Yes [provider]  co-enzyme Q-10 30 MG capsule Take 30 mg by mouth daily.   Yes [provider]  metFORMIN (GLUCOPHAGE) 500 MG tablet 250 mg.  01/26/15  Yes [provider]  methazolamide (NEPTAZANE) 50 MG tablet Take 1 tablet (50 mg total) by mouth daily. 02/22/15  Yes Huston Foley, MD  Multiple Vitamins-Minerals  (PRESERVISION AREDS 2 PO) Take by mouth.   Yes [provider]  omeprazole (PRILOSEC) 10 MG capsule Take 10 mg by mouth daily.   Yes [provider]  primidone (MYSOLINE) 50 MG tablet Take 1.5 tablets (75 mg total) by mouth 2 (two) times daily. 06/10/23  Yes Butch Penny, NP  propranolol (INDERAL) 10 MG tablet Take 1 tablet (10 mg total) by mouth 3 (three) times daily as needed. 07/24/23  Yes Butch Penny, NP  propranolol ER (INDERAL LA) 60 MG 24 hr capsule TAKE ONE CAPSULE BY MOUTH DAILY 04/25/23  Yes Butch Penny, NP  sertraline (ZOLOFT) 50 MG tablet Take 50 mg by mouth daily.   Yes [provider]  simvastatin (ZOCOR) 10 MG tablet 10 mg.  02/09/15  Yes [provider]    Blood pressure 132/80, pulse 69, height 5\' 1"  (1.549 m), weight 179 lb (81.2 kg), SpO2 92%. Exam: General: Communicates without difficulty, well nourished, no acute distress. Head: Normocephalic, no evidence injury, no tenderness, facial buttresses intact without stepoff. Face/sinus: No tenderness to palpation and percussion. Facial movement is normal and symmetric. Eyes: PERRL, EOMI. No scleral icterus, conjunctivae clear. Neuro: CN II exam reveals vision grossly intact.  No nystagmus at any point of gaze. Ears: Auricles well formed without lesions.  Ear canals are intact without mass or lesion.  No erythema or edema is appreciated.  The TMs are  intact without fluid. Nose: External evaluation reveals normal support and skin without lesions.  Dorsum is intact.  Anterior rhinoscopy reveals normal mucosa over anterior aspect of inferior turbinates and intact septum.  No purulence noted. Oral:  Oral cavity and oropharynx are intact, symmetric, without erythema or edema.  Mucosa is moist without lesions. Neck: Full range of motion without pain.  There is no significant lymphadenopathy.  No masses palpable.  Thyroid bed within normal limits to palpation.  Parotid glands and submandibular glands  equal bilaterally without mass.  Trachea is midline. Neuro:  CN 2-12 grossly intact.   Assessment: 1.  Bilateral high-frequency sensorineural hearing loss. 2.  Recent audiogram at [facility] showed right low-frequency conductive hearing loss. 3.  Her ear canals, tympanic membranes, and middle ear spaces are all normal.  No middle ear effusion is noted.  Her conductive hearing loss may be secondary to otosclerosis or other middle ear pathologies.  She is otherwise asymptomatic.  Plan: 1.  The physical exam findings are reviewed with the patient. 2.  She is reassured that no middle ear effusion or infection is noted today. 3.  The treatment options are discussed.  Options include conservative observation, radiographic imaging, or surgical exploration.  Since the patient is currently asymptomatic, the decision is made to proceed with conservative observation. 4.  Continue the use of her hearing aids. 5.  The patient will return for reevaluation in 1 year, sooner if needed.  [name] W [name] 10/08/2023, 11:45 AM

## 2023-10-21 ENCOUNTER — Other Ambulatory Visit: Payer: Self-pay | Admitting: Adult Health

## 2023-10-21 ENCOUNTER — Other Ambulatory Visit: Payer: Self-pay

## 2023-10-21 DIAGNOSIS — G250 Essential tremor: Secondary | ICD-10-CM

## 2023-10-30 ENCOUNTER — Other Ambulatory Visit: Payer: Self-pay | Admitting: Adult Health

## 2023-10-30 ENCOUNTER — Other Ambulatory Visit: Payer: Self-pay

## 2023-10-30 DIAGNOSIS — G250 Essential tremor: Secondary | ICD-10-CM

## 2023-11-11 ENCOUNTER — Encounter (INDEPENDENT_AMBULATORY_CARE_PROVIDER_SITE_OTHER): Admitting: Ophthalmology

## 2023-11-11 DIAGNOSIS — H353122 Nonexudative age-related macular degeneration, left eye, intermediate dry stage: Secondary | ICD-10-CM

## 2023-11-11 DIAGNOSIS — H35033 Hypertensive retinopathy, bilateral: Secondary | ICD-10-CM | POA: Diagnosis not present

## 2023-11-11 DIAGNOSIS — H353211 Exudative age-related macular degeneration, right eye, with active choroidal neovascularization: Secondary | ICD-10-CM

## 2023-11-11 DIAGNOSIS — I10 Essential (primary) hypertension: Secondary | ICD-10-CM | POA: Diagnosis not present

## 2023-11-11 DIAGNOSIS — H43813 Vitreous degeneration, bilateral: Secondary | ICD-10-CM

## 2023-11-13 ENCOUNTER — Telehealth: Payer: Self-pay | Admitting: Neurology

## 2023-11-13 NOTE — Telephone Encounter (Signed)
 Pt called in regards to reschedule Appt.  Appt Scheduled

## 2023-12-09 ENCOUNTER — Ambulatory Visit: Payer: Medicare Other | Admitting: Neurology

## 2023-12-18 ENCOUNTER — Ambulatory Visit: Payer: Medicare Other | Admitting: Neurology

## 2023-12-23 ENCOUNTER — Encounter (INDEPENDENT_AMBULATORY_CARE_PROVIDER_SITE_OTHER): Admitting: Ophthalmology

## 2023-12-23 DIAGNOSIS — I10 Essential (primary) hypertension: Secondary | ICD-10-CM | POA: Diagnosis not present

## 2023-12-23 DIAGNOSIS — H353211 Exudative age-related macular degeneration, right eye, with active choroidal neovascularization: Secondary | ICD-10-CM | POA: Diagnosis not present

## 2023-12-23 DIAGNOSIS — H353122 Nonexudative age-related macular degeneration, left eye, intermediate dry stage: Secondary | ICD-10-CM

## 2023-12-23 DIAGNOSIS — H43813 Vitreous degeneration, bilateral: Secondary | ICD-10-CM

## 2023-12-23 DIAGNOSIS — H35033 Hypertensive retinopathy, bilateral: Secondary | ICD-10-CM | POA: Diagnosis not present

## 2023-12-31 ENCOUNTER — Encounter: Payer: Self-pay | Admitting: Neurology

## 2023-12-31 ENCOUNTER — Ambulatory Visit: Admitting: Neurology

## 2023-12-31 VITALS — BP 139/78 | HR 70 | Ht 61.0 in | Wt 179.0 lb

## 2023-12-31 DIAGNOSIS — G250 Essential tremor: Secondary | ICD-10-CM

## 2023-12-31 DIAGNOSIS — G4733 Obstructive sleep apnea (adult) (pediatric): Secondary | ICD-10-CM
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
## 2024-01-20 ENCOUNTER — Other Ambulatory Visit: Payer: Self-pay | Admitting: Adult Health

## 2024-01-20 DIAGNOSIS — G250 Essential tremor: Secondary | ICD-10-CM

## 2024-01-22 ENCOUNTER — Ambulatory Visit: Admitting: Neurology

## 2024-02-03 ENCOUNTER — Encounter (INDEPENDENT_AMBULATORY_CARE_PROVIDER_SITE_OTHER): Admitting: Ophthalmology

## 2024-02-03 DIAGNOSIS — H353122 Nonexudative age-related macular degeneration, left eye, intermediate dry stage: Secondary | ICD-10-CM

## 2024-02-03 DIAGNOSIS — H353211 Exudative age-related macular degeneration, right eye, with active choroidal neovascularization: Secondary | ICD-10-CM

## 2024-02-03 DIAGNOSIS — H35033 Hypertensive retinopathy, bilateral: Secondary | ICD-10-CM

## 2024-02-03 DIAGNOSIS — I10 Essential (primary) hypertension: Secondary | ICD-10-CM | POA: Diagnosis not present

## 2024-02-03 DIAGNOSIS — H43813 Vitreous degeneration, bilateral: Secondary | ICD-10-CM

## 2024-03-16 ENCOUNTER — Encounter (INDEPENDENT_AMBULATORY_CARE_PROVIDER_SITE_OTHER): Admitting: Ophthalmology

## 2024-03-16 DIAGNOSIS — H353211 Exudative age-related macular degeneration, right eye, with active choroidal neovascularization: Secondary | ICD-10-CM | POA: Diagnosis not present

## 2024-03-16 DIAGNOSIS — H353122 Nonexudative age-related macular degeneration, left eye, intermediate dry stage: Secondary | ICD-10-CM

## 2024-03-16 DIAGNOSIS — H43813 Vitreous degeneration, bilateral: Secondary | ICD-10-CM

## 2024-03-16 DIAGNOSIS — I10 Essential (primary) hypertension: Secondary | ICD-10-CM | POA: Diagnosis not present

## 2024-03-16 DIAGNOSIS — H35033 Hypertensive retinopathy, bilateral: Secondary | ICD-10-CM

## 2024-04-18 ENCOUNTER — Other Ambulatory Visit: Payer: Self-pay | Admitting: Neurology

## 2024-04-18 DIAGNOSIS — G250 Essential tremor: Secondary | ICD-10-CM

## 2024-04-27 ENCOUNTER — Encounter (INDEPENDENT_AMBULATORY_CARE_PROVIDER_SITE_OTHER): Admitting: Ophthalmology

## 2024-04-27 DIAGNOSIS — H353211 Exudative age-related macular degeneration, right eye, with active choroidal neovascularization: Secondary | ICD-10-CM

## 2024-04-27 DIAGNOSIS — H35033 Hypertensive retinopathy, bilateral: Secondary | ICD-10-CM | POA: Diagnosis not present

## 2024-04-27 DIAGNOSIS — H353122 Nonexudative age-related macular degeneration, left eye, intermediate dry stage: Secondary | ICD-10-CM

## 2024-04-27 DIAGNOSIS — H43813 Vitreous degeneration, bilateral: Secondary | ICD-10-CM

## 2024-04-27 DIAGNOSIS — I10 Essential (primary) hypertension: Secondary | ICD-10-CM

## 2024-06-08 ENCOUNTER — Encounter (INDEPENDENT_AMBULATORY_CARE_PROVIDER_SITE_OTHER): Admitting: Ophthalmology

## 2024-06-08 DIAGNOSIS — H35033 Hypertensive retinopathy, bilateral: Secondary | ICD-10-CM | POA: Diagnosis not present

## 2024-06-08 DIAGNOSIS — I10 Essential (primary) hypertension: Secondary | ICD-10-CM | POA: Diagnosis not present

## 2024-06-08 DIAGNOSIS — H353122 Nonexudative age-related macular degeneration, left eye, intermediate dry stage: Secondary | ICD-10-CM

## 2024-06-08 DIAGNOSIS — H353211 Exudative age-related macular degeneration, right eye, with active choroidal neovascularization: Secondary | ICD-10-CM | POA: Diagnosis not present

## 2024-06-08 DIAGNOSIS — H43813 Vitreous degeneration, bilateral: Secondary | ICD-10-CM

## 2024-07-17 ENCOUNTER — Other Ambulatory Visit: Payer: Self-pay | Admitting: Neurology

## 2024-07-17 ENCOUNTER — Other Ambulatory Visit: Payer: Self-pay | Admitting: Adult Health

## 2024-07-17 DIAGNOSIS — G250 Essential tremor: Secondary | ICD-10-CM

## 2024-07-24 ENCOUNTER — Encounter (INDEPENDENT_AMBULATORY_CARE_PROVIDER_SITE_OTHER): Admitting: Ophthalmology

## 2024-07-25 ENCOUNTER — Emergency Department (HOSPITAL_COMMUNITY)

## 2024-07-25 ENCOUNTER — Other Ambulatory Visit: Payer: Self-pay

## 2024-07-25 ENCOUNTER — Emergency Department (HOSPITAL_COMMUNITY)
Admission: EM | Admit: 2024-07-25 | Discharge: 2024-07-25 | Disposition: A | Discharge status: Home / Self Care | Attending: Emergency Medicine | Admitting: Emergency Medicine

## 2024-07-25 ENCOUNTER — Encounter (HOSPITAL_COMMUNITY): Payer: Self-pay

## 2024-07-25 DIAGNOSIS — Z79899 Other long term (current) drug therapy: Secondary | ICD-10-CM | POA: Diagnosis not present

## 2024-07-25 DIAGNOSIS — R300 Dysuria: Secondary | ICD-10-CM | POA: Diagnosis not present

## 2024-07-25 DIAGNOSIS — R42 Dizziness and giddiness: Secondary | ICD-10-CM | POA: Insufficient documentation

## 2024-07-25 DIAGNOSIS — N3000 Acute cystitis without hematuria: Secondary | ICD-10-CM

## 2024-07-25 DIAGNOSIS — R2681 Unsteadiness on feet: Secondary | ICD-10-CM | POA: Diagnosis not present

## 2024-07-25 LAB — BASIC METABOLIC PANEL WITH GFR
Anion gap: 10 (ref 5–15)
BUN: 13 mg/dL (ref 8–23)
CO2: 28 mmol/L (ref 22–32)
Calcium: 9.4 mg/dL (ref 8.9–10.3)
Chloride: 103 mmol/L (ref 98–111)
Creatinine, Ser: 0.77 mg/dL (ref 0.44–1.00)
GFR, Estimated: >60 mL/min
Glucose, Bld: 122 mg/dL — ABNORMAL HIGH (ref 70–99)
Potassium: 4.2 mmol/L (ref 3.5–5.1)
Sodium: 141 mmol/L (ref 135–145)

## 2024-07-25 LAB — URINALYSIS, ROUTINE W REFLEX MICROSCOPIC
Bilirubin Urine: NEGATIVE
Glucose, UA: NEGATIVE mg/dL
Hgb urine dipstick: NEGATIVE
Ketones, ur: NEGATIVE mg/dL
Nitrite: POSITIVE — AB
Protein, ur: NEGATIVE mg/dL
Specific Gravity, Urine: 1.014 (ref 1.005–1.030)
pH: 5 (ref 5.0–8.0)

## 2024-07-25 LAB — CBC
HCT: 39.5 % (ref 36.0–46.0)
Hemoglobin: 12.7 g/dL (ref 12.0–15.0)
MCH: 29.8 pg (ref 26.0–34.0)
MCHC: 32.2 g/dL (ref 30.0–36.0)
MCV: 92.7 fL (ref 80.0–100.0)
Platelets: 231 10*3/uL (ref 150–400)
RBC: 4.26 MIL/uL (ref 3.87–5.11)
RDW: 12.7 % (ref 11.5–15.5)
WBC: 7.2 10*3/uL (ref 4.0–10.5)
nRBC: 0 % (ref 0.0–0.2)

## 2024-07-25 MED ORDER — CEPHALEXIN 500 MG PO CAPS: 500.0000 mg | ORAL_CAPSULE | Freq: Two times a day (BID) | ORAL | 0 refills | Status: AC

## 2024-07-25 MED ORDER — CEPHALEXIN 500 MG PO CAPS: 500.0000 mg | ORAL_CAPSULE | Freq: Two times a day (BID) | ORAL | 0 refills | Status: DC

## 2024-07-25 MED ORDER — CEPHALEXIN 250 MG PO CAPS
500.0000 mg | ORAL_CAPSULE | ORAL | Status: AC
  Administered 2024-07-25: 500 mg via ORAL
  Filled 2024-07-25: qty 2

## 2024-07-25 NOTE — ED Triage Notes (Signed)
 PT sent by [facility] for CT and bloodwork r/t unsteadiness since Monday.

## 2024-07-25 NOTE — ED Provider Notes (Signed)
 "  EMERGENCY DEPARTMENT AT [facility] Provider Note   CSN: [id number] Arrival date & time: 07/25/24  1238     Patient presents with: Dizziness   Erica [name] is a 89 y.o. female with overall noncontributory past medical history who presents with lightheadedness, unsteadiness on feet only in the morning for the last 5 days.  Denies any nausea, vomiting, diarrhea, chest pain, shortness of breath, palpitations.  Denies any numbness, tingling.  At time my assessment she reports that symptoms have resolved.  Son reports that she had been somewhat wobbly in the past with a urinary tract infection and was concerned that this may be the case again.    Dizziness      Prior to Admission medications  Medication Sig Start Date End Date Taking? Authorizing Provider  cephALEXin  (KEFLEX ) 500 MG capsule Take 1 capsule (500 mg total) by mouth 2 (two) times daily for 7 days. 07/25/24 08/01/24 Yes Yolande Lamar BROCKS, MD  cholecalciferol (VITAMIN D) 1000 UNITS tablet Take by mouth daily.    [provider]  co-enzyme Q-10 30 MG capsule Take 30 mg by mouth daily.    [provider]  metFORMIN (GLUCOPHAGE) 500 MG tablet 250 mg.  01/26/15   [provider]  methazolamide  (NEPTAZANE ) 50 MG tablet Take 1 tablet (50 mg total) by mouth daily. 02/22/15   Athar, Saima, MD  Multiple Vitamins-Minerals (PRESERVISION AREDS 2 PO) Take by mouth.    [provider]  omeprazole (PRILOSEC) 10 MG capsule Take 10 mg by mouth daily.    [provider]  primidone  (MYSOLINE ) 50 MG tablet Take 1.5 tablets (75 mg total) by mouth 2 (two) times daily. 07/21/24   Athar, Saima, MD  propranolol  (INDERAL ) 10 MG tablet Take 1 tablet (10 mg total) by mouth 3 (three) times daily as needed. 07/21/24   Athar, Saima, MD  propranolol  ER (INDERAL  LA) 60 MG 24 hr capsule TAKE ONE CAPSULE BY MOUTH DAILY 04/20/24   Athar, Saima, MD  sertraline (ZOLOFT) 50 MG tablet Take 50 mg by mouth  daily.    [provider]  simvastatin (ZOCOR) 10 MG tablet 10 mg.  02/09/15   [provider]    Allergies: Patient has no known allergies.    Review of Systems  Neurological:  Positive for dizziness.  All other systems reviewed and are negative.   Updated Vital Signs BP (!) 140/91   Pulse 69   Temp 98.8 F (37.1 C)   Resp 18   SpO2 96%   Physical Exam Vitals and nursing note reviewed.  Constitutional:      General: She is not in acute distress.    Appearance: Normal appearance.  HENT:     Head: Normocephalic and atraumatic.  Eyes:     General:        Right eye: No discharge.        Left eye: No discharge.  Cardiovascular:     Rate and Rhythm: Normal rate and regular rhythm.     Heart sounds: No murmur heard.    No friction rub. No gallop.  Pulmonary:     Effort: Pulmonary effort is normal.     Breath sounds: Normal breath sounds.  Abdominal:     General: Bowel sounds are normal.     Palpations: Abdomen is soft.  Skin:    General: Skin is warm and dry.     Capillary Refill: Capillary refill takes less than 2 seconds.  Neurological:  Mental Status: She is alert and oriented to person, place, and time.     Comments: Intact strength 5/5 of bilateral upper and lower extremities.  Able to stand, ambulate without difficulty, normal Romberg, normal gait.  Sensation intact bilaterally  Psychiatric:        Mood and Affect: Mood normal.        Behavior: Behavior normal.     (all labs ordered are listed, but only abnormal results are displayed) Labs Reviewed  BASIC METABOLIC PANEL WITH GFR - Abnormal; Notable for the following components:      Result Value   Glucose, Bld 122 (*)    All other components within normal limits  URINALYSIS, ROUTINE W REFLEX MICROSCOPIC - Abnormal; Notable for the following components:   APPearance CLOUDY (*)    Nitrite POSITIVE (*)    Leukocytes,Ua MODERATE (*)    Bacteria, UA RARE (*)    All other components within  normal limits  URINE CULTURE  CBC    EKG: EKG Interpretation Date/Time:  Saturday July 25 2024 13:52:18 EST Ventricular Rate:  60 PR Interval:  202 QRS Duration:  82 QT Interval:  390 QTC Calculation: 390 R Axis:   18  Text Interpretation: Normal sinus rhythm Possible Inferior infarct , age undetermined Possible Anterolateral infarct , age undetermined Abnormal ECG When compared with ECG of 01-Nov-2022 03:23, PREVIOUS ECG IS PRESENT Confirmed by Yolande Charleston [phone]) on 07/25/2024 2:27:50 PM  Radiology: CT Head Wo Contrast Result Date: 07/25/2024 CLINICAL DATA:  Lightheadedness for several days.  Presyncope. EXAM: CT HEAD WITHOUT CONTRAST TECHNIQUE: Contiguous axial images were obtained from the base of the skull through the vertex without intravenous contrast. RADIATION DOSE REDUCTION: This exam was performed according to the departmental dose-optimization program which includes automated exposure control, adjustment of the mA and/or kV according to patient size and/or use of iterative reconstruction technique. COMPARISON:  10/31/2022 FINDINGS: Brain: No evidence of intracranial hemorrhage, acute infarction, hydrocephalus, extra-axial collection, or mass lesion/mass effect. Moderate chronic small vessel disease shows no significant change. Vascular:  No hyperdense vessel or other acute findings. Skull: No evidence of fracture or other significant bone abnormality. Sinuses/Orbits:  No acute findings. Other: None. IMPRESSION: No acute intracranial abnormality. Stable chronic small vessel disease. Electronically Signed   By: Norleen DELENA Kil M.D.   On: 07/25/2024 14:58     Procedures   Medications Ordered in the ED  cephALEXin  (KEFLEX ) capsule 500 mg (has no administration in time range)                                    Medical Decision Making Amount and/or Complexity of Data Reviewed Labs: ordered. Radiology: ordered.  Risk Prescription drug management.   This patient is a  89 y.o. female  who presents to the ED for concern of lightheadedness.   Differential diagnoses prior to evaluation: The emergent differential diagnosis includes, but is not limited to,  BPPV, vestibular migraine, head trauma, AVM, intracranial tumor, multiple sclerosis, drug-related, CVA, orthostatic hypotension, sepsis, hypoglycemia, electrolyte disturbance, anemia, anxiety . This is not an exhaustive differential.   Past Medical History / Co-morbidities / Social History: Overall noncontributory  Physical Exam: Physical exam performed. The pertinent findings include: No focal neurologic deficits, Romberg negative, normal gait.  Sensation intact bilaterally.  Intact strength 5/5 bilateral upper and lower extremities.  Normal vital signs on arrival although did develop small hypertension in the emergency department,  blood pressure 140/91.  Lab Tests/Imaging studies: I personally interpreted labs/imaging and the pertinent results include: CBC overall unremarkable, BMP overall unremarkable, UA with nitrate positive, leukocytes, 21-50 white blood cells and rare bacteria, consistent with acute urinary tract infection, will send for culture and plan to treat.  CT head without contrast with no evidence of acute intracranial abnormality.. I agree with the radiologist interpretation.  Cardiac monitoring: EKG obtained and interpreted by myself and attending physician which shows: Normal sinus rhythm, no acute ST-T changes   Medications: Will discharge with medication for urinary tract infection, otherwise encourage close PCP follow-up, low clinical suspicion of acute neurologic abnormality, especially with symptoms intermittent for 5 days, resolving after morning, her wobbliness on waking may be secondary to her urinary tract infection, encourage plenty fluids, hydration.   Disposition: After consideration of the diagnostic results and the patients response to treatment, I feel that patient stable for  discharge with plan as above.   I discussed this case with my attending physician who cosigned this note including patient's presenting symptoms, physical exam, and planned diagnostics and interventions. Attending physician stated agreement with plan or made changes to plan which were implemented.   Attending physician assessed patient at bedside.  emergency department workup does not suggest an emergent condition requiring admission or immediate intervention beyond what has been performed at this time. The plan is: as above. The patient is safe for discharge and has been instructed to return immediately for worsening symptoms, change in symptoms or any other concerns.   Final diagnoses:  None    ED Discharge Orders          Ordered    cephALEXin  (KEFLEX ) 500 MG capsule  2 times daily        07/25/24 1538               Calogero Geisen, [place], [place] 07/25/24 1629  "

## 2024-07-25 NOTE — ED Provider Triage Note (Signed)
 Emergency Medicine Provider Triage Evaluation Note  Erica [name] , a 89 y.o. female  was evaluated in triage.  Pt complains of Patient presents with  balance off, Wobbling since Monday. Every morning has been unsteady by afternoon pt seems to get more steady, not dizzy, light headed, UTI 6 months ago with wobbliness, no ear pain or drainage. .  Seen in urgent care and sent here for further workup.  Review of Systems  Positive: Lightheaded, wobbly Negative: Dizzy, chest pain, shob  Physical Exam  BP 128/78 (BP Location: Left Arm)   Pulse 60   Temp 98.8 F (37.1 C)   Resp 16   SpO2 94%  Gen:   Awake, no distress   Resp:  Normal effort  MSK:   Moves extremities without difficulty  Other:  To stand and ambulate at her baseline on my exam and confirmed by son  Medical Decision Making  Medically screening exam initiated at 12:57 PM.  Appropriate orders placed.  Erica [name] was informed that the remainder of the evaluation will be completed by another provider, this initial triage assessment does not replace that evaluation, and the importance of remaining in the ED until their evaluation is complete.  Workup initiated in triage    Erica [name], [place] 07/25/24 1304

## 2024-07-25 NOTE — Discharge Instructions (Addendum)
 Your workup today shows urinary tract infection, we sent medication to your pharmacy, please take the entire course.  Please follow-up with your primary care doctor.  We sent your urine for culture, if the urinary tract infection is not treated effectively by the antibiotics that we have sent to you will receive a call from the pharmacist to change her antibiotics.  If you have worsening dizziness, lightheadedness, fall MS, tingling please return to the emergency department for further evaluation.

## 2024-07-25 NOTE — ED Triage Notes (Signed)
 Pt c/o feeling int lightheaded and unsteady on feetx5d. Pt denies N/V/D.

## 2024-07-27 ENCOUNTER — Encounter (INDEPENDENT_AMBULATORY_CARE_PROVIDER_SITE_OTHER): Admitting: Ophthalmology

## 2024-07-28 LAB — URINE CULTURE: Culture: >=100000 — AB

## 2024-07-29 ENCOUNTER — Telehealth (HOSPITAL_BASED_OUTPATIENT_CLINIC_OR_DEPARTMENT_OTHER): Payer: Self-pay | Admitting: *Deleted

## 2024-07-29 NOTE — Telephone Encounter (Signed)
 Post ED Visit - Positive Culture Follow-up  Culture report reviewed by antimicrobial stewardship pharmacist: [facility] [x]  Leonor Bash, [place].D. []  Venetia Gully, Pharm.D., BCPS AQ-ID []  Garrel Crews, Pharm.D., BCPS []  Almarie Lunger, Pharm.D., BCPS []  [place], [place].D., BCPS, AAHIVP []  Rosaline Bihari, Pharm.D., BCPS, AAHIVP []  Vernell Meier, PharmD, BCPS []  Latanya Hint, PharmD, BCPS []  Donald Medley, PharmD, BCPS []  Rocky Bold, PharmD []  Dorothyann Alert, PharmD, BCPS []  Morene Babe, PharmD  [facility] []  Rosaline Edison, PharmD []  Romona Bliss, PharmD []  Dolphus Roller, PharmD []  Veva Seip, Rph []  Vernell Daunt) Leonce, PharmD []  Eva Allis, PharmD []  Rosaline Millet, PharmD []  Iantha Batch, PharmD []  Arvin Gauss, PharmD []  Wanda Hasting, PharmD []  Ronal Rav, PharmD []  Rocky Slade, PharmD []  Bard Jeans, PharmD   Positive urine culture Treated with cephalexin , organism sensitive to the same and no further patient follow-up is required at this time.  Erica [name] 07/29/2024, 12:48 PM

## 2024-07-30 ENCOUNTER — Encounter (INDEPENDENT_AMBULATORY_CARE_PROVIDER_SITE_OTHER): Admitting: Ophthalmology

## 2024-07-30 DIAGNOSIS — H43813 Vitreous degeneration, bilateral: Secondary | ICD-10-CM | POA: Diagnosis not present

## 2024-07-30 DIAGNOSIS — H35033 Hypertensive retinopathy, bilateral: Secondary | ICD-10-CM | POA: Diagnosis not present

## 2024-07-30 DIAGNOSIS — H353211 Exudative age-related macular degeneration, right eye, with active choroidal neovascularization: Secondary | ICD-10-CM

## 2024-07-30 DIAGNOSIS — H353122 Nonexudative age-related macular degeneration, left eye, intermediate dry stage: Secondary | ICD-10-CM | POA: Diagnosis not present

## 2024-07-30 DIAGNOSIS — I10 Essential (primary) hypertension: Secondary | ICD-10-CM | POA: Diagnosis not present

## 2024-09-14 ENCOUNTER — Encounter (INDEPENDENT_AMBULATORY_CARE_PROVIDER_SITE_OTHER): Admitting: Ophthalmology

## 2024-09-30 ENCOUNTER — Ambulatory Visit: Admitting: Adult Health
